# Patient Record
Sex: Male | Born: 1935 | Race: White | Hispanic: No | Smoking: Former smoker
Health system: Southern US, Community
[De-identification: ages and names within clinical notes are randomized; demographics above are authoritative.]

## PROBLEM LIST (undated history)

## (undated) DIAGNOSIS — R209 Unspecified disturbances of skin sensation: Secondary | ICD-10-CM

## (undated) DIAGNOSIS — M542 Cervicalgia: Secondary | ICD-10-CM

## (undated) DIAGNOSIS — K219 Gastro-esophageal reflux disease without esophagitis: Secondary | ICD-10-CM

## (undated) DIAGNOSIS — IMO0001 Reserved for inherently not codable concepts without codable children: Secondary | ICD-10-CM

## (undated) DIAGNOSIS — R0602 Shortness of breath: Secondary | ICD-10-CM

## (undated) DIAGNOSIS — R5381 Other malaise: Secondary | ICD-10-CM

## (undated) DIAGNOSIS — R21 Rash and other nonspecific skin eruption: Secondary | ICD-10-CM

## (undated) DIAGNOSIS — E039 Hypothyroidism, unspecified: Secondary | ICD-10-CM

## (undated) DIAGNOSIS — H269 Unspecified cataract: Secondary | ICD-10-CM

## (undated) DIAGNOSIS — G5600 Carpal tunnel syndrome, unspecified upper limb: Secondary | ICD-10-CM

## (undated) DIAGNOSIS — R269 Unspecified abnormalities of gait and mobility: Secondary | ICD-10-CM

## (undated) DIAGNOSIS — M545 Low back pain: Secondary | ICD-10-CM

## (undated) DIAGNOSIS — F411 Generalized anxiety disorder: Secondary | ICD-10-CM

## (undated) DIAGNOSIS — K5900 Constipation, unspecified: Secondary | ICD-10-CM

## (undated) DIAGNOSIS — C61 Malignant neoplasm of prostate: Secondary | ICD-10-CM

## (undated) DIAGNOSIS — M7150 Other bursitis, not elsewhere classified, unspecified site: Secondary | ICD-10-CM

## (undated) DIAGNOSIS — R29890 Loss of height: Secondary | ICD-10-CM

## (undated) DIAGNOSIS — R634 Abnormal weight loss: Secondary | ICD-10-CM

## (undated) DIAGNOSIS — E785 Hyperlipidemia, unspecified: Secondary | ICD-10-CM

## (undated) DIAGNOSIS — L738 Other specified follicular disorders: Secondary | ICD-10-CM

## (undated) DIAGNOSIS — I4891 Unspecified atrial fibrillation: Secondary | ICD-10-CM

## (undated) DIAGNOSIS — L2089 Other atopic dermatitis: Secondary | ICD-10-CM

## (undated) DIAGNOSIS — L723 Sebaceous cyst: Secondary | ICD-10-CM

## (undated) DIAGNOSIS — I10 Essential (primary) hypertension: Secondary | ICD-10-CM

## (undated) DIAGNOSIS — I499 Cardiac arrhythmia, unspecified: Secondary | ICD-10-CM

## (undated) DIAGNOSIS — K210 Gastro-esophageal reflux disease with esophagitis: Secondary | ICD-10-CM

## (undated) DIAGNOSIS — N3941 Urge incontinence: Secondary | ICD-10-CM

## (undated) DIAGNOSIS — E559 Vitamin D deficiency, unspecified: Secondary | ICD-10-CM

## (undated) DIAGNOSIS — S0180XA Unspecified open wound of other part of head, initial encounter: Secondary | ICD-10-CM

## (undated) DIAGNOSIS — R351 Nocturia: Secondary | ICD-10-CM

## (undated) HISTORY — DX: Low back pain: M54.5

## (undated) HISTORY — DX: Shortness of breath: R06.02

## (undated) HISTORY — DX: Urge incontinence: N39.41

## (undated) HISTORY — DX: Other specified follicular disorders: L73.8

## (undated) HISTORY — DX: Loss of height: R29.890

## (undated) HISTORY — DX: Cervicalgia: M54.2

## (undated) HISTORY — PX: EYE SURGERY: SHX253

## (undated) HISTORY — DX: Other atopic dermatitis: L20.89

## (undated) HISTORY — PX: LEG SURGERY: SHX1003

## (undated) HISTORY — DX: Gastro-esophageal reflux disease without esophagitis: K21.9

## (undated) HISTORY — DX: Nocturia: R35.1

## (undated) HISTORY — DX: Vitamin D deficiency, unspecified: E55.9

## (undated) HISTORY — DX: Unspecified abnormalities of gait and mobility: R26.9

## (undated) HISTORY — DX: Unspecified disturbances of skin sensation: R20.9

## (undated) HISTORY — DX: Generalized anxiety disorder: F41.1

## (undated) HISTORY — DX: Reserved for inherently not codable concepts without codable children: IMO0001

## (undated) HISTORY — DX: Malignant neoplasm of prostate: C61

## (undated) HISTORY — DX: Unspecified atrial fibrillation: I48.91

## (undated) HISTORY — DX: Sebaceous cyst: L72.3

## (undated) HISTORY — DX: Gastro-esophageal reflux disease with esophagitis: K21.0

## (undated) HISTORY — DX: Abnormal weight loss: R63.4

## (undated) HISTORY — DX: Rash and other nonspecific skin eruption: R21

## (undated) HISTORY — DX: Other bursitis, not elsewhere classified, unspecified site: M71.50

## (undated) HISTORY — DX: Unspecified cataract: H26.9

## (undated) HISTORY — DX: Carpal tunnel syndrome, unspecified upper limb: G56.00

## (undated) HISTORY — DX: Constipation, unspecified: K59.00

## (undated) HISTORY — PX: CATARACT EXTRACTION W/ INTRAOCULAR LENS  IMPLANT, BILATERAL: SHX1307

## (undated) HISTORY — DX: Unspecified open wound of other part of head, initial encounter: S01.80XA

## (undated) HISTORY — DX: Hypothyroidism, unspecified: E03.9

## (undated) HISTORY — DX: Other malaise: R53.81

---

## 1998-08-30 HISTORY — PX: APPENDECTOMY: SHX54

## 2001-01-03 ENCOUNTER — Inpatient Hospital Stay (HOSPITAL_COMMUNITY): Admission: EM | Admit: 2001-01-03 | Discharge: 2001-01-06 | Payer: Self-pay | Admitting: Emergency Medicine

## 2001-01-03 ENCOUNTER — Encounter: Payer: Self-pay | Admitting: Emergency Medicine

## 2005-03-08 ENCOUNTER — Ambulatory Visit (HOSPITAL_COMMUNITY): Admission: RE | Admit: 2005-03-08 | Discharge: 2005-03-08 | Payer: Self-pay | Admitting: Orthopedic Surgery

## 2005-08-30 DIAGNOSIS — K2100 Gastro-esophageal reflux disease with esophagitis, without bleeding: Secondary | ICD-10-CM

## 2005-08-30 DIAGNOSIS — M545 Low back pain, unspecified: Secondary | ICD-10-CM

## 2005-08-30 HISTORY — DX: Low back pain, unspecified: M54.50

## 2005-08-30 HISTORY — DX: Gastro-esophageal reflux disease with esophagitis, without bleeding: K21.00

## 2006-04-13 ENCOUNTER — Ambulatory Visit (HOSPITAL_BASED_OUTPATIENT_CLINIC_OR_DEPARTMENT_OTHER): Admission: RE | Admit: 2006-04-13 | Discharge: 2006-04-13 | Payer: Self-pay | Admitting: Urology

## 2006-05-06 ENCOUNTER — Ambulatory Visit: Admission: RE | Admit: 2006-05-06 | Discharge: 2006-05-22 | Payer: Self-pay | Admitting: Radiation Oncology

## 2006-09-27 ENCOUNTER — Ambulatory Visit: Admission: RE | Admit: 2006-09-27 | Discharge: 2006-12-26 | Payer: Self-pay | Admitting: Radiation Oncology

## 2006-11-04 ENCOUNTER — Ambulatory Visit (HOSPITAL_BASED_OUTPATIENT_CLINIC_OR_DEPARTMENT_OTHER): Admission: RE | Admit: 2006-11-04 | Discharge: 2006-11-04 | Payer: Self-pay | Admitting: Urology

## 2008-08-30 DIAGNOSIS — N3941 Urge incontinence: Secondary | ICD-10-CM

## 2008-08-30 HISTORY — DX: Urge incontinence: N39.41

## 2009-08-30 DIAGNOSIS — R209 Unspecified disturbances of skin sensation: Secondary | ICD-10-CM

## 2009-08-30 DIAGNOSIS — E559 Vitamin D deficiency, unspecified: Secondary | ICD-10-CM

## 2009-08-30 DIAGNOSIS — G5600 Carpal tunnel syndrome, unspecified upper limb: Secondary | ICD-10-CM

## 2009-08-30 DIAGNOSIS — E039 Hypothyroidism, unspecified: Secondary | ICD-10-CM

## 2009-08-30 DIAGNOSIS — IMO0001 Reserved for inherently not codable concepts without codable children: Secondary | ICD-10-CM

## 2009-08-30 DIAGNOSIS — R269 Unspecified abnormalities of gait and mobility: Secondary | ICD-10-CM

## 2009-08-30 HISTORY — DX: Hypothyroidism, unspecified: E03.9

## 2009-08-30 HISTORY — DX: Unspecified disturbances of skin sensation: R20.9

## 2009-08-30 HISTORY — DX: Carpal tunnel syndrome, unspecified upper limb: G56.00

## 2009-08-30 HISTORY — DX: Vitamin D deficiency, unspecified: E55.9

## 2009-08-30 HISTORY — DX: Unspecified abnormalities of gait and mobility: R26.9

## 2009-08-30 HISTORY — DX: Reserved for inherently not codable concepts without codable children: IMO0001

## 2010-08-30 DIAGNOSIS — L738 Other specified follicular disorders: Secondary | ICD-10-CM

## 2010-08-30 DIAGNOSIS — M7150 Other bursitis, not elsewhere classified, unspecified site: Secondary | ICD-10-CM

## 2010-08-30 DIAGNOSIS — L723 Sebaceous cyst: Secondary | ICD-10-CM

## 2010-08-30 HISTORY — DX: Other specified follicular disorders: L73.8

## 2010-08-30 HISTORY — DX: Sebaceous cyst: L72.3

## 2010-08-30 HISTORY — DX: Other bursitis, not elsewhere classified, unspecified site: M71.50

## 2010-09-20 ENCOUNTER — Encounter: Payer: Self-pay | Admitting: Internal Medicine

## 2010-11-04 ENCOUNTER — Other Ambulatory Visit: Payer: Self-pay | Admitting: Internal Medicine

## 2010-11-04 DIAGNOSIS — M549 Dorsalgia, unspecified: Secondary | ICD-10-CM

## 2010-11-05 ENCOUNTER — Other Ambulatory Visit: Payer: Self-pay

## 2010-11-05 ENCOUNTER — Ambulatory Visit
Admission: RE | Admit: 2010-11-05 | Discharge: 2010-11-05 | Disposition: A | Discharge status: Home / Self Care | Payer: Medicare Other | Source: Ambulatory Visit | Attending: Internal Medicine | Admitting: Internal Medicine

## 2010-11-05 ENCOUNTER — Inpatient Hospital Stay
Admission: RE | Admit: 2010-11-05 | Discharge: 2010-11-05 | Payer: Self-pay | Source: Ambulatory Visit | Attending: Internal Medicine | Admitting: Internal Medicine

## 2010-11-05 ENCOUNTER — Ambulatory Visit
Admission: RE | Admit: 2010-11-05 | Discharge: 2010-11-05 | Disposition: A | Discharge status: Home / Self Care | Payer: PRIVATE HEALTH INSURANCE | Source: Ambulatory Visit | Attending: Internal Medicine | Admitting: Internal Medicine

## 2010-11-05 DIAGNOSIS — M549 Dorsalgia, unspecified: Secondary | ICD-10-CM

## 2011-08-31 DIAGNOSIS — M542 Cervicalgia: Secondary | ICD-10-CM

## 2011-08-31 DIAGNOSIS — L2089 Other atopic dermatitis: Secondary | ICD-10-CM

## 2011-08-31 DIAGNOSIS — F411 Generalized anxiety disorder: Secondary | ICD-10-CM

## 2011-08-31 DIAGNOSIS — R351 Nocturia: Secondary | ICD-10-CM

## 2011-08-31 DIAGNOSIS — I4891 Unspecified atrial fibrillation: Secondary | ICD-10-CM

## 2011-08-31 DIAGNOSIS — R0602 Shortness of breath: Secondary | ICD-10-CM

## 2011-08-31 HISTORY — DX: Generalized anxiety disorder: F41.1

## 2011-08-31 HISTORY — DX: Shortness of breath: R06.02

## 2011-08-31 HISTORY — DX: Cervicalgia: M54.2

## 2011-08-31 HISTORY — DX: Unspecified atrial fibrillation: I48.91

## 2011-08-31 HISTORY — DX: Nocturia: R35.1

## 2011-08-31 HISTORY — DX: Other atopic dermatitis: L20.89

## 2011-09-23 ENCOUNTER — Other Ambulatory Visit: Payer: Self-pay | Admitting: Dermatology

## 2012-08-29 ENCOUNTER — Other Ambulatory Visit: Payer: Self-pay | Admitting: Nurse Practitioner

## 2012-08-29 ENCOUNTER — Ambulatory Visit
Admission: RE | Admit: 2012-08-29 | Discharge: 2012-08-29 | Disposition: A | Discharge status: Home / Self Care | Payer: Medicare Other | Source: Ambulatory Visit | Attending: Nurse Practitioner | Admitting: Nurse Practitioner

## 2012-08-29 DIAGNOSIS — R0602 Shortness of breath: Secondary | ICD-10-CM

## 2012-09-01 DIAGNOSIS — R5381 Other malaise: Secondary | ICD-10-CM

## 2012-09-01 HISTORY — DX: Other malaise: R53.81

## 2012-10-10 ENCOUNTER — Encounter (HOSPITAL_COMMUNITY): Payer: Self-pay | Admitting: Gastroenterology

## 2012-10-10 ENCOUNTER — Encounter (HOSPITAL_COMMUNITY): Payer: Self-pay | Admitting: *Deleted

## 2012-10-10 ENCOUNTER — Ambulatory Visit (HOSPITAL_COMMUNITY)
Admission: RE | Admit: 2012-10-10 | Discharge: 2012-10-10 | Disposition: A | Discharge status: Home / Self Care | Payer: Medicare Other | Source: Ambulatory Visit | Attending: Cardiology | Admitting: Cardiology

## 2012-10-10 ENCOUNTER — Encounter (HOSPITAL_COMMUNITY)
Admission: RE | Disposition: A | Discharge status: Home / Self Care | Payer: Self-pay | Source: Ambulatory Visit | Attending: Cardiology

## 2012-10-10 ENCOUNTER — Ambulatory Visit (HOSPITAL_COMMUNITY): Payer: Medicare Other | Admitting: *Deleted

## 2012-10-10 DIAGNOSIS — I4891 Unspecified atrial fibrillation: Secondary | ICD-10-CM

## 2012-10-10 DIAGNOSIS — I10 Essential (primary) hypertension: Secondary | ICD-10-CM | POA: Insufficient documentation

## 2012-10-10 HISTORY — PX: CARDIOVERSION: SHX1299

## 2012-10-10 HISTORY — DX: Hyperlipidemia, unspecified: E78.5

## 2012-10-10 HISTORY — DX: Essential (primary) hypertension: I10

## 2012-10-10 HISTORY — DX: Cardiac arrhythmia, unspecified: I49.9

## 2012-10-10 SURGERY — CARDIOVERSION
Anesthesia: General

## 2012-10-10 MED ORDER — PROPOFOL 10 MG/ML IV BOLUS
INTRAVENOUS | Status: DC | PRN
  Administered 2012-10-10: 40 mg via INTRAVENOUS

## 2012-10-10 MED ORDER — LIDOCAINE HCL (CARDIAC) 20 MG/ML IV SOLN
INTRAVENOUS | Status: DC | PRN
  Administered 2012-10-10: 40 mg via INTRAVENOUS

## 2012-10-10 MED ORDER — SODIUM CHLORIDE 0.9 % IV SOLN
INTRAVENOUS | Status: DC
  Administered 2012-10-10: 500 mL via INTRAVENOUS

## 2012-10-10 MED ORDER — SODIUM CHLORIDE 0.9 % IV SOLN
INTRAVENOUS | Status: DC | PRN
  Administered 2012-10-10: 12:00:00 via INTRAVENOUS

## 2012-10-10 NOTE — Anesthesia Preprocedure Evaluation (Addendum)
Anesthesia Evaluation  Patient identified by MRN, date of birth, ID band Patient awake    Reviewed: Allergy & Precautions, H&P , NPO status , Patient's Chart, lab work & pertinent test results, reviewed documented beta blocker date and time   Airway Mallampati: II      Dental  (+) Upper Dentures, Lower Dentures and Dental Advisory Given   Pulmonary neg pulmonary ROS,  breath sounds clear to auscultation        Cardiovascular hypertension, Pt. on medications and Pt. on home beta blockers + dysrhythmias Atrial Fibrillation Rhythm:Irregular Rate:Normal  Echo 08/2012, ef 55%   Neuro/Psych    GI/Hepatic negative GI ROS, Neg liver ROS,   Endo/Other  negative endocrine ROS  Renal/GU negative Renal ROS     Musculoskeletal   Abdominal Normal abdominal exam  (+)   Peds  Hematology negative hematology ROS (+)   Anesthesia Other Findings   Reproductive/Obstetrics                          Anesthesia Physical Anesthesia Plan  ASA: III  Anesthesia Plan: General   Post-op Pain Management:    Induction: Intravenous  Airway Management Planned: Mask  Additional Equipment:   Intra-op Plan:   Post-operative Plan:   Informed Consent: I have reviewed the patients History and Physical, chart, labs and discussed the procedure including the risks, benefits and alternatives for the proposed anesthesia with the patient or authorized representative who has indicated his/her understanding and acceptance.   Dental advisory given  Plan Discussed with: Anesthesiologist and Surgeon  Anesthesia Plan Comments:        Anesthesia Quick Evaluation

## 2012-10-10 NOTE — Preoperative (Signed)
Beta Blockers   Reason not to administer Beta Blockers:Not Applicable 

## 2012-10-10 NOTE — Anesthesia Postprocedure Evaluation (Signed)
  Anesthesia Post-op Note  Patient: Wyatt Perkins  Procedure(s) Performed: Procedure(s): CARDIOVERSION (N/A)  Patient Location: PACU  Anesthesia Type:General  Level of Consciousness: awake and patient cooperative  Airway and Oxygen Therapy: Patient Spontanous Breathing  Post-op Pain: none  Post-op Assessment: Post-op Vital signs reviewed, Patient's Cardiovascular Status Stable, Respiratory Function Stable, Patent Airway, No signs of Nausea or vomiting and Pain level controlled  Post-op Vital Signs: stable  Complications: No apparent anesthesia complications

## 2012-10-10 NOTE — Transfer of Care (Signed)
Immediate Anesthesia Transfer of Care Note  Patient: Wyatt Perkins  Procedure(s) Performed: Procedure(s): CARDIOVERSION (N/A)  Patient Location: PACU  Anesthesia Type:General  Level of Consciousness: awake, oriented, patient cooperative and responds to stimulation  Airway & Oxygen Therapy: Patient Spontanous Breathing and Patient connected to nasal cannula oxygen  Post-op Assessment: Report given to PACU RN and Post -op Vital signs reviewed and stable  Post vital signs: Reviewed and stable  Complications: No apparent anesthesia complications

## 2012-10-10 NOTE — CV Procedure (Signed)
Direct current cardioversion:  Indication symptomatic A. Fibrillation.  Procedure: Using 40 mg of IV Propofol and 40 mg  Lidocaine for achieving deep (Moderate sedation), synchronized direct current cardioversion performed. Patient was delivered with 100 Joules of electricity X 1 with success to NSR. Patient tolerated the procedure well. No immediate complication noted.

## 2012-10-10 NOTE — H&P (Signed)
  Please see paper chart  

## 2012-10-10 NOTE — Interval H&P Note (Signed)
History and Physical Interval Note:  10/10/2012 12:30 PM  Wyatt Perkins  has presented today for surgery, with the diagnosis of a-fib  The various methods of treatment have been discussed with the patient and family. After consideration of risks, benefits and other options for treatment, the patient has consented to  Procedure(s): CARDIOVERSION (N/A) as a surgical intervention .  The patient's history has been reviewed, patient examined, no change in status, stable for surgery.  I have reviewed the patient's chart and labs.  Questions were answered to the patient's satisfaction.     [name]

## 2012-10-11 ENCOUNTER — Encounter (HOSPITAL_COMMUNITY): Payer: Self-pay | Admitting: Cardiology

## 2012-10-25 DIAGNOSIS — R634 Abnormal weight loss: Secondary | ICD-10-CM

## 2012-10-25 HISTORY — DX: Abnormal weight loss: R63.4

## 2012-11-03 DIAGNOSIS — S0180XA Unspecified open wound of other part of head, initial encounter: Secondary | ICD-10-CM

## 2012-11-03 HISTORY — DX: Unspecified open wound of other part of head, initial encounter: S01.80XA

## 2012-12-20 ENCOUNTER — Non-Acute Institutional Stay: Payer: Medicare Other | Admitting: Geriatric Medicine

## 2012-12-20 ENCOUNTER — Encounter: Payer: Self-pay | Admitting: Geriatric Medicine

## 2012-12-20 VITALS — BP 112/72 | HR 68 | Wt 149.0 lb

## 2012-12-20 DIAGNOSIS — K219 Gastro-esophageal reflux disease without esophagitis: Secondary | ICD-10-CM

## 2012-12-20 DIAGNOSIS — F411 Generalized anxiety disorder: Secondary | ICD-10-CM

## 2012-12-20 DIAGNOSIS — R634 Abnormal weight loss: Secondary | ICD-10-CM

## 2012-12-20 DIAGNOSIS — I4891 Unspecified atrial fibrillation: Secondary | ICD-10-CM

## 2012-12-20 HISTORY — DX: Gastro-esophageal reflux disease without esophagitis: K21.9

## 2012-12-20 NOTE — Assessment & Plan Note (Addendum)
Heart w/ regular rhythm today. Continues Xarelto. Will need interval CBC,BMP, most recent satisfcatory

## 2012-12-20 NOTE — Assessment & Plan Note (Signed)
Patient had 20 pound weight loss from December 2013 to February 2014. He said that 4 pound weight loss since February. No specific GI complaints other than indigestion. Denies any change in stool a day change in bowel habits or gross blood in the stool. Will check for occult blood in stool.

## 2012-12-20 NOTE — Assessment & Plan Note (Signed)
Long-standing problem with reflux. Until several months ago was well controlled with omeprazole and occasional TUMS. Symptoms worsened December 2013, omeprazole dose increased. Family notes that patient is taking TUMS nearly everyday on a recent vacation. Patient tells me it isn't really every day maybe one to 3 times a week. Asked patient to keep track of how often he requires TUMS, have patient come back in 2 weeks, consider GI consult.

## 2012-12-20 NOTE — Progress Notes (Signed)
Patient ID: Wyatt Perkins, male   DOB: 22-Sep-1925, 79 y.o.   MRN: [id number] Chief Complaint  Patient presents with  . Medical Managment of Chronic Issues    weight loss, anxiety, blood pressure, A-Fib   HPI: This 79 year old male resident of [facility], Assisted Living section, returns to clinic today in followup of his anxiety, weight loss, hypertension, atrial fibrillation. He has generally been doing well; has recently returned from a very enjoyable vacation at the beach with his family. He continues to be very focused on his spouse and her situation in Memory Care. Blood pressure has remained reasonably well-controlled, atrial fibrillation stable. He has remained in regular rhythm since cardioversion in February 2014. He has continued with some further weight loss since last visit. Patient reports that he is eating very well, has no GI complaints, notes no change in his bowel habits. Does have  persistent indigestion despite daily PPI. He reports that taking TUMS helps. Patient reports TUMS one to 3 days a week however family recently reported that he was eating TUMS daily while on vacation.  Allergies Reviewed   Medications Reviewed   Data Reviewed    Radiology:   Lab [facility], external 08/01/2012 BMP; Sodium 132, Potassium 4.2, glucose 100, BUN 16, Creatinine 0.83 TSH 6.613 PSA 0.08 08/28/12: WBC 10.9, hgb 12.5, Hct 36.6, Plt 281 TSH 3.32 Glu 90, BUN 12, Cr. .89, na 129, K+ 3.9, prot/albumin WNL. AST 63, ALT 64 CK, MB 20.4 08/29/12  CK, total 887 Sed Rate 2 C-Reactive Protein 2.31 Troponin 1 0.04 09/14/2012 BMP: Sodium 122, Potassium 3.5, glucose 102, BUN 12, Creatinine 0.81  10/26/2012 WBC 6.4, hemoglobin 12.7, hematocrit hematocrit 36.5, platelets 222  Glucose 85, BUN 13, creatinine 0.80, sodium 127, potassium 3.0. A 3.8  Total cholesterol 158, triglyceride 84, HDL LDL 108  TSH 2.39  12/07/2012: Glu93, BUN 19. Cr. .81, Na 134, K+ 3.5  CRP <0.5     Review of  Systems   DATA OBTAINED: from patient,medical record. GENERAL: Feels well. No fevers, fatigue, change in activity status. No change in appetite. Further weight loss  SKIN: No itch, rash or open wounds EYES: No eye pain, dryness or itching. No change in vision EARS: No earache, tinnitus, change in hearing NOSE: No congestion, drainage or bleeding MOUTH/THROAT: No mouth or tooth pain. No difficulty chewing or swallowing. RESPIRATORY: No cough, wheezing, SOB CARDIAC: No chest pain, palpitations. No edema. GI: No abdominal pain. No N/V/D or constipation. Frequent indigestion, see history of present illness.  GU: No dysuria, frequency or urgency. No nocturia or change in stream. No change in urine volume or character MUSCULOSKELETAL: No joint pain, swelling or stiffness. No back pain. No muscle ache, pain, weakness. Gait is steady.   NEUROLOGIC: No dizziness, fainting, headache, imbalance, numbness. No change in mental status.  PSYCHIATRIC: Anxiety present. No depression, behavior issue. Sleeps well.    Physical Exam    Filed Vitals:   12/20/12 1001  BP: 112/72  Pulse: 68   Filed Weights   12/20/12 1001  Weight: 149 lb (67.586 kg)   GENERAL APPEARANCE: No acute distress, appropriately groomed, normal body habitus. Alert, pleasant, conversant. HEAD: Normocephalic, atraumatic EYES: Conjunctiva/lids clear. Pupils round, reactive.  EARS:Hearing grossly normal. NOSE: No deformity or discharge. MOUTH/THROAT: Lips w/o lesions. Oral mucosa, tongue moist, w/o lesion. Oropharynx w/o redness or lesions.  NECK: Supple, full ROM. No thyroid tenderness, enlargement or nodule LYMPHATICS: No head, neck or supraclavicular adenopathy RESPIRATORY: Breathing is even, unlabored. Lung sounds  are clear and full.  CARDIOVASCULAR: Heart RRR. No murmur or extra heart sounds   EDEMA: Trace peripheral edema.   GASTROINTESTINAL: Abdomen is soft, non-tender, not distended w/ normal bowel sounds.   MUSCULOSKELETAL: Moves all extremities with full ROM, strength and tone. Back is without kyphosis, scoliosis or spinal process tenderness. Gait is steady NEUROLOGIC: Oriented to time, place, person. Speech clear, no tremor.  PSYCHIATRIC: Mood and affect appropriate to situation  ASSESSMENT/PLAN  Atrial fibrillation Heart w/ regular rhythm today. Continues Xarelto. Will need interval CBC,BMP, most recent satisfcatory  GERD (gastroesophageal reflux disease) Long-standing problem with reflux. Until several months ago was well controlled with omeprazole and occasional TUMS. Symptoms worsened December 2013, omeprazole dose increased. Family notes that patient is taking TUMS nearly everyday on a recent vacation. Patient tells me it isn't really every day maybe one to 3 times a week. Asked patient to keep track of how often he requires TUMS, have patient come back in 2 weeks, consider GI consult.  Loss of weight Patient had 20 pound weight loss from December 2013 to February 2014. He said that 4 pound weight loss since February. No specific GI complaints other than indigestion. Denies any change in stool a day change in bowel habits or gross blood in the stool. Will check for occult blood in stool.   Anxiety state, unspecified Patient reports vacation away from [facility] was "wonderful". Since his return, continues to visit his wife daily in Memory Care unit. Patient reports his family is encouraging him to participate in other activities. He acknowledges this is a good idea but he has not followed through. Pt. Continues to display need to verbally work out each detail of his life.  He has not been back to see Dr. Jen Mow lately, he cites a problem with the office location.  Encouraged to arrange return appointment.    Follow up:  2weeks  Vearl Aitken T.Verlin Uher, NP-C 12/20/2012

## 2012-12-24 NOTE — Assessment & Plan Note (Signed)
Patient reports vacation away from [facility] was "wonderful". Since his return, continues to visit his wife daily in Memory Care unit. Patient reports his family is encouraging him to participate in other activities. He acknowledges this is a good idea but he has not followed through. Pt. Continues to display need to verbally work out each detail of his life.  He has not been back to see Dr. Jen Mow lately, he cites a problem with the office location.  Encouraged to arrange return appointment.

## 2012-12-25 ENCOUNTER — Encounter: Payer: Self-pay | Admitting: Internal Medicine

## 2012-12-25 ENCOUNTER — Non-Acute Institutional Stay: Payer: Medicare Other | Admitting: Internal Medicine

## 2012-12-25 VITALS — BP 128/72 | HR 78 | Wt 150.0 lb

## 2012-12-25 DIAGNOSIS — R21 Rash and other nonspecific skin eruption: Secondary | ICD-10-CM

## 2012-12-25 DIAGNOSIS — K219 Gastro-esophageal reflux disease without esophagitis: Secondary | ICD-10-CM

## 2012-12-25 NOTE — Progress Notes (Signed)
  Subjective:    Patient ID: Wyatt Perkins, male    DOB: 1926-08-08, 79 y.o.   MRN: [id number]  HPI  Rash for 1-2 week. He used Amlactin moisturizing body lotion, but it made things worse. Rash is scattered over the right and left arms and legs. Scaly lesions on the legs have been there for a long time. Pruritic and burning sensations.  Also having acid reflux.Marland Kitchen Has been using Maalox 2-4 days per week. Hx of recurrent reflux. Worse at night.  Review of Systems  Constitutional: Negative for fever, chills, diaphoresis, activity change, appetite change, fatigue and unexpected weight change.  HENT: Negative for hearing loss, ear pain, nosebleeds, congestion, facial swelling, rhinorrhea, neck pain, neck stiffness, tinnitus and ear discharge.   Eyes: Negative.   Cardiovascular: Negative for chest pain, palpitations and leg swelling.  Gastrointestinal: Positive for constipation. Negative for abdominal pain, abdominal distention and anal bleeding.  Endocrine: Negative.   Skin:       Rash as described.  Allergic/Immunologic: Negative.   Neurological: Positive for numbness.  Hematological: Negative.   Psychiatric/Behavioral: The patient is nervous/anxious.        Objective:   Physical Exam  Nursing note and vitals reviewed. Constitutional: He is oriented to person, place, and time. He appears well-developed and well-nourished. No distress.  HENT:  Head: Normocephalic and atraumatic.  Mouth/Throat: Oropharynx is clear and moist. No oropharyngeal exudate.  Loss of hearing  Neck: Normal range of motion. Neck supple. No JVD present. No tracheal deviation present. No thyromegaly present.  Cardiovascular: Normal rate, normal heart sounds and intact distal pulses.  Exam reveals no gallop and no friction rub.   No murmur heard. AF.  Pulmonary/Chest: Effort normal and breath sounds normal. No respiratory distress. He has no wheezes.  Abdominal: Soft. Bowel sounds are normal. He exhibits no  distension and no mass. There is no tenderness.  Musculoskeletal: Normal range of motion. He exhibits no edema and no tenderness.  Lymphadenopathy:    He has no cervical adenopathy.  Neurological: He is alert and oriented to person, place, and time. He has normal reflexes. No cranial nerve deficit. Coordination normal.  Skin: He is not diaphoretic.  Red cracked and scaling skin. Worse on the forearms, r>L. Rash on the legs has a different appearance. There are red blotches, but the cracks and peeling on the forearms is missing. Rash is limited to sun exposed areas. No rash on the trunk or back or upper thighs posteriorly.  Psychiatric: He has a normal mood and affect. His behavior is normal. Judgment and thought content normal.          Assessment & Plan:  Rash and other nonspecific skin eruption  This is difficult to tell whether this is a phototoxic reaction or a mildly sunburned area that was made worse by applying AmLactin lotion. I believe it should respond to triamcinolone acetonide 0.1% cream daily.  GERD (gastroesophageal reflux disease)  Recurrent problems reflux especially at night. Try ranitidine 75 mg nightly.

## 2012-12-26 MED ORDER — TRIAMCINOLONE ACETONIDE 0.1 % EX CREA: TOPICAL_CREAM | CUTANEOUS | Status: DC

## 2012-12-26 MED ORDER — RANITIDINE HCL 75 MG PO TABS: ORAL_TABLET | ORAL | Status: DC

## 2012-12-26 NOTE — Patient Instructions (Signed)
Return in 2 weeks for reevaluation. You'll be seeing [name] that time. You have a follow up visit to see Dr. Chilton Si 02/05/13.

## 2013-01-10 ENCOUNTER — Encounter: Payer: Self-pay | Admitting: Geriatric Medicine

## 2013-01-10 ENCOUNTER — Non-Acute Institutional Stay: Payer: Medicare Other | Admitting: Geriatric Medicine

## 2013-01-10 VITALS — BP 144/68 | HR 80 | Ht 67.0 in | Wt 148.0 lb

## 2013-01-10 DIAGNOSIS — R21 Rash and other nonspecific skin eruption: Secondary | ICD-10-CM

## 2013-01-10 DIAGNOSIS — Z299 Encounter for prophylactic measures, unspecified: Secondary | ICD-10-CM

## 2013-01-10 DIAGNOSIS — R634 Abnormal weight loss: Secondary | ICD-10-CM

## 2013-01-10 DIAGNOSIS — F411 Generalized anxiety disorder: Secondary | ICD-10-CM

## 2013-01-10 DIAGNOSIS — K219 Gastro-esophageal reflux disease without esophagitis: Secondary | ICD-10-CM

## 2013-01-10 NOTE — Assessment & Plan Note (Signed)
Continues to have episodes of tearfulness re: spouse's situation /cognitive decline. Spent 30 minutes with patient allowing him to express his feelings and offer strategies to reduce stress/ anxiety. Suggested increased interactions with other men in community who are facing simlar situations.

## 2013-01-10 NOTE — Assessment & Plan Note (Signed)
Sumpotms improved wit addition of ranitidine; no recent use of TUMS.

## 2013-01-10 NOTE — Progress Notes (Signed)
Patient ID: Wyatt Perkins, male   DOB: 09-Apr-1926, 79 y.o.   MRN: [id number] [facility] [phone])  Chief Complaint  Patient presents with  . Medical Managment of Chronic Issues    GERD, follow up on rash    HPI: This 79 year old male resident of [facility], Assisted Living section returns to clinic today in followup of GERD and a rash on his right forearm. At last visit with Dr. Chilton Si he added ranitidine to patient's medications, patient tells me he has not needed to use any TUMS since that time. Patient does tend to minimize GERD symptoms, reporting that he is had this all his life. He does specifically deny any chest discomfort,coughing, nausea. Patient has been applying triamcinolone cream as directed onto his right forearm rash. It is nearly healed. Patient continues to feel significant stress related to his spouse's Alzheimer's disease and new delirium.  Allergies  Allergies  Allergen Reactions  . Simvastatin     myalgias  . Statins     All statins cause muscle cramps   Medications Reviewed  Data Reviewed       [id number], external 08/01/2012 BMP; Sodium 132, Potassium 4.2, glucose 100, BUN 16, Creatinine 0.83  TSH 6.613  PSA 0.08  08/28/12: WBC 10.9, hgb 12.5, Hct 36.6, Plt 281  TSH 3.32  Glu 90, BUN 12, Cr. .89, na 129, K+ 3.9, prot/albumin WNL. AST 63, ALT 64  CK, MB 20.4  08/29/12 CK, total 887  Sed Rate 2  C-Reactive Protein 2.31  Troponin 1 0.04  09/14/2012 BMP: Sodium 122, Potassium 3.5, glucose 102, BUN 12, Creatinine 0.81  10/26/2012 WBC 6.4, hemoglobin 12.7, hematocrit hematocrit 36.5, platelets 222  Glucose 85, BUN 13, creatinine 0.80, sodium 127, potassium 3.0. A 3.8  Total cholesterol 158, triglyceride 84, HDL LDL 108  TSH 2.39  12/07/2012: Glu93, BUN 19. Cr. .81, Na 134, K+ 3.5  CRP <0.5  Review of Systems  DATA OBTAINED: from patient,medical record.  GENERAL: Feels well. No fevers, fatigue, change in  activity status. No change in appetite. Further weight loss (2lb) SKIN: No itch, rash or open wounds  EYES: No eye pain, dryness or itching. No change in vision  EARS: No earache, tinnitus, change in hearing  NOSE: No congestion, drainage or bleeding  MOUTH/THROAT: No mouth or tooth pain. No difficulty chewing or swallowing.  RESPIRATORY: No cough, wheezing, SOB  CARDIAC: No chest pain, palpitations. No edema.  GI: No abdominal pain. No N/V/D or constipation. Frequent indigestion, see history of present illness.  GU: No dysuria, frequency or urgency. No nocturia or change in stream. No change in urine volume or character  MUSCULOSKELETAL: No joint pain, swelling or stiffness. No back pain. No muscle ache, pain, weakness. Gait is steady.  NEUROLOGIC: No dizziness, fainting, headache, imbalance, numbness. No change in mental status.  PSYCHIATRIC: Anxiety present. No depression, behavior issue. Sleeps well.   Physical Exam Filed Vitals:   01/10/13 0956  BP: 144/68  Pulse: 80  Height: 5\' 7"  (1.702 m)  Weight: 148 lb (67.132 kg)   Body mass index is 23.17 kg/(m^2).  GENERAL APPEARANCE: No acute distress, appropriately groomed, normal body habitus. Alert, pleasant, conversant.  HEAD: Normocephalic, atraumatic  EYES: Conjunctiva/lids clear. Pupils round, reactive.  EARS:Hearing grossly normal.  NOSE: No deformity or discharge.  MOUTH/THROAT: Lips w/o lesions. Oral mucosa, tongue moist, w/o lesion. Oropharynx w/o redness or lesions.  NECK: Supple, full ROM. No thyroid tenderness, enlargement or nodule  LYMPHATICS: No head, neck  or supraclavicular adenopathy  RESPIRATORY: Breathing is even, unlabored. Lung sounds are clear and full.  CARDIOVASCULAR: Heart RRR. No murmur or extra heart sounds  EDEMA: Trace peripheral edema.  GASTROINTESTINAL: Abdomen is soft, non-tender, not distended w/ normal bowel sounds.  MUSCULOSKELETAL: Moves all extremities with full ROM, strength and tone. Back is  without kyphosis, scoliosis or spinal process tenderness. Gait is steady  NEUROLOGIC: Oriented to time, place, person. Speech clear, no tremor.  PSYCHIATRIC: Mood and affect appropriate to situation    ASSESSMENT/PLAN  Anxiety state, unspecified Continues to have episodes of tearfulness re: spouse's situation /cognitive decline. Spent 30 minutes with patient allowing him to express his feelings and offer strategies to reduce stress/ anxiety. Suggested increased interactions with other men in community who are facing simlar situations.  GERD (gastroesophageal reflux disease) Sumpotms improved wit addition of ranitidine; no recent use of TUMS.  Loss of weight Weight stable at 148-150lbs last 3 months. Pt. continues to report good appetite, though he does admit to leaving food on the plate sometimes (portions too big). NO GI symptoms, stool heme negative. No intervention at this time, continue to monitor  Rash and other nonspecific skin eruption Rash Rt. Forearm nearly healed. Continue topical treatment until tube is finished   40 minutes spent with patient  Follow up: As scheduled  Oryon Gary T.Dexton Zwilling, NP-C 01/10/2013

## 2013-01-10 NOTE — Assessment & Plan Note (Signed)
Rash Rt. Forearm nearly healed. Continue topical treatment until tube is finished

## 2013-01-10 NOTE — Assessment & Plan Note (Signed)
Weight stable at 148-150lbs last 3 months. Pt. continues to report good appetite, though he does admit to leaving food on the plate sometimes (portions too big). NO GI symptoms, stool heme negative. No intervention at this time, continue to monitor

## 2013-02-02 ENCOUNTER — Encounter: Payer: Self-pay | Admitting: *Deleted

## 2013-02-05 ENCOUNTER — Encounter: Payer: Self-pay | Admitting: Internal Medicine

## 2013-02-05 ENCOUNTER — Non-Acute Institutional Stay: Payer: Medicare Other | Admitting: Internal Medicine

## 2013-02-05 VITALS — BP 120/64 | HR 60 | Ht 67.0 in | Wt 148.0 lb

## 2013-02-05 DIAGNOSIS — R634 Abnormal weight loss: Secondary | ICD-10-CM

## 2013-02-05 DIAGNOSIS — K219 Gastro-esophageal reflux disease without esophagitis: Secondary | ICD-10-CM

## 2013-02-05 DIAGNOSIS — C61 Malignant neoplasm of prostate: Secondary | ICD-10-CM

## 2013-02-05 DIAGNOSIS — N643 Galactorrhea not associated with childbirth: Secondary | ICD-10-CM | POA: Insufficient documentation

## 2013-02-05 DIAGNOSIS — I4891 Unspecified atrial fibrillation: Secondary | ICD-10-CM

## 2013-02-05 DIAGNOSIS — E785 Hyperlipidemia, unspecified: Secondary | ICD-10-CM

## 2013-02-05 DIAGNOSIS — R197 Diarrhea, unspecified: Secondary | ICD-10-CM | POA: Insufficient documentation

## 2013-02-05 DIAGNOSIS — E039 Hypothyroidism, unspecified: Secondary | ICD-10-CM

## 2013-02-05 MED ORDER — PSYLLIUM 28 % PO PACK: PACK | ORAL | Status: DC

## 2013-02-05 NOTE — Progress Notes (Signed)
  Subjective:    Patient ID: Wyatt Perkins, male    DOB: 15-Jan-1926, 79 y.o.   MRN: [id number]  HPI Has lost weight.  Unspecified hypothyroidism: Controlled. TSH was high at 6.613 07/31/12 was 5.386 on 01/25/12. It needs to be rechecked.  Malignant neoplasm of prostate: History. No evidence for relapse.  Other and unspecified hyperlipidemia: Most recent lab showed adequate control.  Loss of weight: Etiology is uncertain. He tends to blame this on his medications, but I don't really see any association  Atrial fibrillation: New since the beginning of the year. Currently rate controlled. Has seen Dr. Jacinto Halim.  GERD (gastroesophageal reflux disease): Asymptomatic  Diarrhea: Patient was put on soma derivatives and has had multiple loose stools since then.He is asking for a change in his medications.     Review of Systems  Constitutional: Negative for fever, chills, diaphoresis, activity change, appetite change, fatigue and unexpected weight change.  HENT: Negative for hearing loss, ear pain, nosebleeds, congestion, facial swelling, rhinorrhea, neck pain, neck stiffness, tinnitus and ear discharge.   Eyes: Negative.   Cardiovascular: Negative for chest pain, palpitations and leg swelling.  Gastrointestinal: Positive for diarrhea. Negative for abdominal pain, constipation, abdominal distention and anal bleeding.  Endocrine: Negative.   Skin:       Rash is resolved.  Allergic/Immunologic: Negative.   Neurological: Positive for numbness.  Hematological: Negative.   Psychiatric/Behavioral: The patient is nervous/anxious.        Objective:   Physical Exam  Nursing note and vitals reviewed. Constitutional: He is oriented to person, place, and time. He appears well-developed and well-nourished. No distress.  HENT:  Head: Normocephalic and atraumatic.  Mouth/Throat: Oropharynx is clear and moist. No oropharyngeal exudate.  Loss of hearing  Neck: Normal range of motion. Neck supple. No  JVD present. No tracheal deviation present. No thyromegaly present.  Cardiovascular: Normal rate, normal heart sounds and intact distal pulses.  Exam reveals no gallop and no friction rub.   No murmur heard. AF.  Pulmonary/Chest: Effort normal and breath sounds normal. No respiratory distress. He has no wheezes.  Abdominal: Soft. Bowel sounds are normal. He exhibits no distension and no mass. There is no tenderness.  Musculoskeletal: Normal range of motion. He exhibits no edema and no tenderness.  Lymphadenopathy:    He has no cervical adenopathy.  Neurological: He is alert and oriented to person, place, and time. He has normal reflexes. No cranial nerve deficit. Coordination normal.  Skin: He is not diaphoretic.  Continues with a mild rash, but it is virtually completely gone. He continues to use steroid cream on this area.  Psychiatric: He has a normal mood and affect. His behavior is normal. Judgment and thought content normal.          Assessment & Plan:  1. Unspecified hypothyroidism Elevated TSH. Followup lab.  2. Malignant neoplasm of prostate No evidence of relapse.  3. Other and unspecified hyperlipidemia Control.  4. Loss of weight Etiology not established. He seems to be regaining weight at this time.  5. Atrial fibrillation Chronic since December 2013. Rate controlled.  6. GERD (gastroesophageal reflux disease) Asymptomatic  7. Diarrhea Possibly induced by laxatives. Discontinued senna - psyllium (METAMUCIL SMOOTH TEXTURE) 28 % packet; One tablespoon in 8 oz fluid daily to regulate stools  Dispense: 30 packet; Refill: 5  8.Rash: Advised to discontinue steroid creams   Return in 6 months for extended visit

## 2013-02-05 NOTE — Patient Instructions (Addendum)
Continue current medication, except we have substituted Metamucil for the Senna to help regulate stools. Reduce and discontinue the steroid creams for the previous rash on her arms.

## 2013-02-08 ENCOUNTER — Encounter: Payer: Self-pay | Admitting: Internal Medicine

## 2013-02-08 ENCOUNTER — Ambulatory Visit (INDEPENDENT_AMBULATORY_CARE_PROVIDER_SITE_OTHER): Payer: Medicare Other | Admitting: Internal Medicine

## 2013-02-08 DIAGNOSIS — Z23 Encounter for immunization: Secondary | ICD-10-CM

## 2013-02-08 MED ORDER — TETANUS-DIPHTH-ACELL PERTUSSIS 5-2.5-18.5 LF-MCG/0.5 IM SUSP
0.5000 mL | Freq: Once | INTRAMUSCULAR | Status: AC
  Administered 2013-02-08: 0.5 mL via INTRAMUSCULAR

## 2013-02-08 MED ORDER — PNEUMOCOCCAL VAC POLYVALENT 25 MCG/0.5ML IJ INJ
0.5000 mL | INJECTION | Freq: Once | INTRAMUSCULAR | Status: AC
  Administered 2013-02-08: 0.5 mL via INTRAMUSCULAR

## 2013-02-08 NOTE — Patient Instructions (Addendum)
You were given information sheets about the tetanus and pneumococcal vaccinations you received. Pneumonia was in left arm, tetanus in right.

## 2013-02-08 NOTE — Progress Notes (Signed)
Patient ID: Wyatt Perkins, male   DOB: [birth year], 79 y.o.   MRN: [id number] Pt was given his pneumococcal and tetanus vaccinations today.

## 2013-02-09 ENCOUNTER — Other Ambulatory Visit: Payer: Self-pay | Admitting: Dermatology

## 2013-03-28 ENCOUNTER — Non-Acute Institutional Stay: Payer: Medicare Other | Admitting: Geriatric Medicine

## 2013-03-28 ENCOUNTER — Encounter: Payer: Self-pay | Admitting: Geriatric Medicine

## 2013-03-28 VITALS — BP 128/82 | HR 88 | Ht 67.0 in | Wt 160.0 lb

## 2013-03-28 DIAGNOSIS — F411 Generalized anxiety disorder: Secondary | ICD-10-CM

## 2013-03-28 DIAGNOSIS — I4891 Unspecified atrial fibrillation: Secondary | ICD-10-CM

## 2013-03-28 DIAGNOSIS — K5900 Constipation, unspecified: Secondary | ICD-10-CM

## 2013-03-28 NOTE — Progress Notes (Signed)
Patient ID: Wyatt Perkins, male   DOB: Jan 08, 1926, 79 y.o.   MRN: [id number] [facility] [phone])  Code Status: DNR  Contact Information   Name Relation Home Work Mobile   Perkins,Wyatt Daughter [phone]        Chief Complaint  Patient presents with  . Depression    wife died few weeks ago, here with son Wyatt Perkins    HPI: This is a 79 y.o. male resident of [facility],  Assisted Living section.  Evaluation is requested today due to shortness of breath.  Patient and family have noted increased shortness of breath and decreased activity status over the last few weeks. He has needed to make frequent rest stops when walking any distance, also notes that he has awakened up during the night "uneasy for air". All these symptoms have improved over the last few days. In fact, today he was able to walk from his apartment all the way to the dining room without stopping. Review of nurses record shows that his blood pressure and pulse readings have been satisfactory. His weight has improved. Patient has been under significant amount of stress this year due his spouse's declining health. This culminated about 2 weeks ago with her death. Patient tells me today he feels he is getting back to some normalcy. Continues to have regular followup with psychiatrist Dr. Jen Perkins    Allergies  Allergen Reactions  . Simvastatin     myalgias  . Statins     All statins cause muscle cramps   Medications Reviewed  DATA REVIEWED  Laboratory Studies:  [facility] 09/14/2012 BMP: Sodium 122, Potassium 3.5, glucose 102, BUN 12, Creatinine 0.81    10/26/2012 WBC 6.4, hemoglobin 12.7, hematocrit hematocrit 36.5, platelets 222    Glucose 85, BUN 13, creatinine 0.80, sodium 127, potassium 3.0. A 3.8    Total cholesterol 158, triglyceride 84, HDL LDL 108    TSH 2.39   9/56/2130: Glu93, BUN 19. Cr. .81, Na 134, K+ 3.5    CRP <0.5  01/30/2013: WBC 5.9, Hgb 13.7, Hct 39.0,  Plt 241  Glu 90, BUN 12 , Cr.80  TSH 5.20  PSA 0.04  History   Social History Narrative   [name] Widowed (02/2013), was married to Wyatt Perkins since 1951. Retired from [employer].  Lives in Assisted Living  section at [facility] since 08/2012, [place] lived [place] apartment in same community [place]  2008.Marland Kitchen   Stopped smoking 1960s.. Drinks moderate alcohol (wine)   Patient has  Advanced planning documents: Living Will, DNR       Review of Systems  DATA OBTAINED: from patient,  medical record,  family member GENERAL: Feels well   No fevers, fatigue, change in appetite or weight SKIN: No itch, rash or open wounds EYES: No eye pain, dryness or itching  No change in vision EARS: No earache, tinnitus, change in hearing NOSE: No congestion, drainage or bleeding MOUTH/THROAT: No mouth or tooth pain  No sore throat No difficulty chewing or swallowing RESPIRATORY: No cough, wheezing, SOB CARDIAC: No chest pain, palpitations  No edema. CHEST/BREASTS: No discomfort, discharge or lumps in breasts GI: No abdominal pain  No N/V/D or constipation  No heartburn or reflux multiple very soft bowel movements daily GU: No dysuria, frequency or urgency  No change in urine volume or character No nocturia or change in stream   MUSCULOSKELETAL: No joint pain, swelling or stiffness  No back pain  No muscle ache, pain, weakness  Gait is steady  No recent falls.  NEUROLOGIC: No dizziness, fainting, headache, imbalance, numbness  No change in mental status.  PSYCHIATRIC: No feelings of anxiety, depression Sleeps well.  No behavior issue.    Physical Exam Filed Vitals:   03/28/13 1631  BP: 128/82  Pulse: 88  Height: 5\' 7"  (1.702 m)  Weight: 160 lb (72.576 kg)   Body mass index is 25.05 kg/(m^2).  GENERAL APPEARANCE: No acute distress, appropriately groomed, normal body habitus. Alert, pleasant, conversant. SKIN: No diaphoresis , unusual lesions, wounds. Mild diffuse macular  rash over cheeks, forearms HEAD: Normocephalic, atraumatic EYES: Conjunctiva/lids clear. Pupils round, reactive. Marland Kitchen  EARS: Decreased hearing    NOSE: No deformity or discharge. MOUTH/THROAT: Lips w/o lesions. Oral mucosa, tongue moist, w/o lesion. Oropharynx w/o redness or lesions.  NECK: Supple, full ROM. No thyroid tenderness, enlargement or nodule LYMPHATICS: No head, neck or supraclavicular adenopathy RESPIRATORY: Breathing is even, unlabored. Lung sounds are clear and full.  CARDIOVASCULAR: Heart IRRR. No murmur or extra heart sounds  ARTERIAL: No carotid bruit.   VENOUS: No varicosities. No venous stasis skin changes  EDEMA: No peripheral  edema.  GASTROINTESTINAL: Abdomen is soft, non-tender, not distended w/ normal bowel sounds.  MUSCULOSKELETAL: Moves all extremities with full ROM, strength and tone. Back is without kyphosis, scoliosis or spinal process tenderness. Gait is steady NEUROLOGIC: Oriented to time, place, person.Speech clear, no tremor.  PSYCHIATRIC: Mood and affect appropriate to situation  ASSESSMENT/PLAN  Atrial fibrillation Cardioversion didn't hold, has been in AF for a while, Dr.Ganji is aware and satisfied with rate control/ anticoagulation. Patient does not appear with a volume overload or any signs of heart failure. Doubt his recent shortness of breath has much to do with his atrial fibrillation.  Anxiety state, unspecified Patient's emotional status has been very fragile with spouse's health decline and death. Increased anxiety surrounding these circumstances as well as anticipation of life status change have likely contributed to patient's shortness of breath with activity. He's also been less active in general and perhaps a little bit more deconditioned. Shortness of breath has improved in the last few days, as has his general activity level have encouraged him to continue daily exercise exercise classes as well as stretching regimen on his own. He benefits  significantly from regular visits with Dr. Jen Perkins.   Unspecified constipation Patient has been receiving MiraLax daily to relieve constipation. Appears this dose may be a little much for him reduce to every other day dosing   Time:  40 minutes spent with patient/son, >50% spent counseling/ care coordination  Follow up: As scheduled   Akisha Sturgill T.Korinne Greenstein, NP-C 03/28/2013

## 2013-03-29 ENCOUNTER — Encounter: Payer: Self-pay | Admitting: Geriatric Medicine

## 2013-03-29 DIAGNOSIS — K5900 Constipation, unspecified: Secondary | ICD-10-CM | POA: Insufficient documentation

## 2013-03-29 HISTORY — DX: Constipation, unspecified: K59.00

## 2013-03-29 NOTE — Assessment & Plan Note (Signed)
Cardioversion didn't hold, has been in AF for a while, Dr.Ganji is aware and satisfied with rate control/ anticoagulation. Patient does not appear with a volume overload or any signs of heart failure. Doubt his recent shortness of breath has much to do with his atrial fibrillation.

## 2013-03-29 NOTE — Assessment & Plan Note (Signed)
Patient has been receiving MiraLax daily to relieve constipation. Appears this dose may be a little much for him reduce to every other day dosing

## 2013-03-29 NOTE — Assessment & Plan Note (Signed)
Patient's emotional status has been very fragile with spouse's health decline and death. Increased anxiety surrounding these circumstances as well as anticipation of life status change have likely contributed to patient's shortness of breath with activity. He's also been less active in general and perhaps a little bit more deconditioned. Shortness of breath has improved in the last few days, as has his general activity level have encouraged him to continue daily exercise exercise classes as well as stretching regimen on his own. He benefits significantly from regular visits with Dr. Jen Mow.

## 2013-04-27 ENCOUNTER — Non-Acute Institutional Stay: Payer: Medicare Other | Admitting: Geriatric Medicine

## 2013-04-27 ENCOUNTER — Other Ambulatory Visit (HOSPITAL_COMMUNITY): Payer: Self-pay | Admitting: Internal Medicine

## 2013-04-27 ENCOUNTER — Encounter: Payer: Self-pay | Admitting: Geriatric Medicine

## 2013-04-27 DIAGNOSIS — J3489 Other specified disorders of nose and nasal sinuses: Secondary | ICD-10-CM

## 2013-04-27 DIAGNOSIS — R1310 Dysphagia, unspecified: Secondary | ICD-10-CM

## 2013-04-27 DIAGNOSIS — R0981 Nasal congestion: Secondary | ICD-10-CM

## 2013-04-27 NOTE — Assessment & Plan Note (Signed)
Mild nasal congestion, improved with saline spray today. Mild elevation in blood pressure may be due to use of cold medicines of cold medicine over the last few days. Recommend stopping the Mucinex formulation, use saline spray twice a day and p.r.n. May also use Tylenol if he has  mild headache related to sinus congestion

## 2013-04-27 NOTE — Progress Notes (Signed)
Patient ID: Wyatt Perkins, male   DOB: 12/08/1925, 79 y.o.   MRN: [id number] [facility] (13)  Code Status: DNR  Contact Information   Name Relation Home Work Mobile   Dubuc,Betsy Daughter [phone]        Chief Complaint  Patient presents with  . Nasal Congestion    HPI: This is a 79 y.o. male resident of [facility], Assisted Living  section.  Patient requested evaluation today do to nasal congestion and mildly elevated blood pressure. He began having symptoms of nasal congestion and mild headache earlier this week, his daughter purchased over-the-counter medication for him, Mucinex D Maximum strength. He felt this gave him pretty good relief of his symptoms however earlier today he felt worse, blood pressure was elevated, he had some shortness of breath and requested evaluation.  Nurse initiated standing order for saline nasal spray. On arrival to patients assisted living apartment this afternoon he was fast asleep, he woke up a bit disoriented. Felt he was in a deep sleep before I arrived.   Tells me he continues to feel very sad about the death of his spouse of 64 years, he misses her very much. He also feels under a lot of stress due to paperwork and correspondence.  The patient has been evaluated recently by SLP due to coughing with meals. Recommendation is made for modified barium swallow study, this is scheduled next week.  Allergies  Allergen Reactions  . Simvastatin     myalgias  . Statins     All statins cause muscle cramps   Medications Reviewed  DATA REVIEWED  Laboratory Studies:  [facility] 09/14/2012 BMP: Sodium 122, Potassium 3.5, glucose 102, BUN 12, Creatinine 0.81    10/26/2012 WBC 6.4, hemoglobin 12.7, hematocrit hematocrit 36.5, platelets 222    Glucose 85, BUN 13, creatinine 0.80, sodium 127, potassium 3.0. A 3.8    Total cholesterol 158, triglyceride 84, HDL LDL 108    TSH 2.39   6/96/2952: Glu93, BUN  19. Cr. .81, Na 134, K+ 3.5    CRP <0.5  01/30/2013: WBC 5.9, Hgb 13.7, Hct 39.0, Plt 241  Glu 90, BUN 12 , Cr.80  TSH 5.20  PSA 0.04     Review of Systems  DATA OBTAINED: from patient, nurse, medical record,   GENERAL: Feels "OK"   No fevers, tires easily, no change in appetite or weight SKIN: No itch, rash or open wounds EYES: No eye pain, dryness or itching  No change in vision EARS: No earache, tinnitus, change in hearing NOSE: Mild nasal congestion this afternoon, feels the nasal spray was helpful earlier today.  MOUTH/THROAT: No mouth or tooth pain  No sore throat Denies difficulty chewing or swallowing, admits to occasional coughing with meals RESPIRATORY: No cough, wheezing, Feels SOB with exertion CARDIAC: No chest pain, palpitations  No edema. GI: No abdominal pain  No N/V/D or constipation  No heartburn or reflux multiple very soft bowel movements daily GU: No dysuria, frequency or urgency  No change in urine volume or character No nocturia or change in stream   MUSCULOSKELETAL: No joint pain, swelling or stiffness  No back pain  No muscle ache, pain, weakness  Gait is steady  No recent falls.  NEUROLOGIC: No dizziness, fainting, headache, imbalance, numbness  .  PSYCHIATRIC:  Sleeps well.   Cries frequently due to grief   Physical Exam Filed Vitals:   04/27/13 1606  BP: 145/97  Pulse: 82  SpO2: 97%  GENERAL APPEARANCE: No acute distress, appropriately groomed, normal body habitus. Alert, pleasant, conversant. SKIN: No diaphoresis , unusual lesions, wounds. Mild diffuse macular rash over cheeks, forearms HEAD: Normocephalic, atraumatic EYES: Conjunctiva/lids clear. Pupils round, reactive. Marland Kitchen  EARS: Decreased hearing    NOSE: No deformity or discharge. MOUTH/THROAT: Lips w/o lesions. Oral mucosa, tongue moist, w/o lesion. Oropharynx w/o redness or lesions.  NECK: Supple, full ROM. No thyroid tenderness, enlargement or nodule LYMPHATICS: No head, neck or  supraclavicular adenopathy RESPIRATORY: Breathing is even, unlabored. Lung sounds are clear and full.  CARDIOVASCULAR: Heart RRR. No murmur or extra heart sounds  EDEMA: No peripheral  edema.  MUSCULOSKELETAL: Moves all extremities with full ROM, strength and tone. Back is without kyphosis, scoliosis or spinal process tenderness. Gait is unsteady, better w/ walker NEUROLOGIC: Oriented to time, place, person.Speech clear, no tremor.  PSYCHIATRIC: Mood and affect appropriate to situation  ASSESSMENT/PLAN  No problem-specific assessment & plan notes found for this encounter.   Follow up: As scheduled   Ladean Steinmeyer T.Nerea Bordenave, NP-C 04/27/2013

## 2013-05-01 ENCOUNTER — Ambulatory Visit (HOSPITAL_COMMUNITY): Payer: Medicare Other

## 2013-05-03 ENCOUNTER — Ambulatory Visit (HOSPITAL_COMMUNITY)
Admission: RE | Admit: 2013-05-03 | Discharge: 2013-05-03 | Disposition: A | Discharge status: Home / Self Care | Payer: Medicare Other | Source: Ambulatory Visit | Attending: Internal Medicine | Admitting: Internal Medicine

## 2013-05-03 DIAGNOSIS — R1310 Dysphagia, unspecified: Secondary | ICD-10-CM

## 2013-05-03 NOTE — Procedures (Signed)
Objective Swallowing Evaluation: Modified Barium Swallowing Study  Patient Details  Name: Wyatt Perkins MRN: [id number] Date of Birth: 28-Jan-1926  Today's Date: 05/03/2013 Time: 6295-2841 SLP Time Calculation (min): 54 min  Past Medical History:  Past Medical History  Diagnosis Date  . Dysrhythmia     atrial fibrillation  . Hyperlipemia   . Hypertension   . Open wound of forehead, without mention of complication 11/03/2012  . Loss of weight 10/25/2012  . Debility, unspecified 09/01/2012  . Anxiety state, unspecified 2013  . Atrial fibrillation 2013  . Shortness of breath 2013  . Other atopic dermatitis and related conditions 2013  . Cervicalgia 2013  . Nocturia 2013  . Sebaceous cyst 2012  . Other specified disease of sebaceous glands 2012  . Other bursitis disorders 2012  . Abnormality of gait 2011  . Unspecified hypothyroidism 2011  . Unspecified vitamin D deficiency 2011  . Carpal tunnel syndrome 2011  . Myalgia and myositis, unspecified 2011  . Disturbance of skin sensation 2011  . Urge incontinence 2010  . Malignant neoplasm of prostate [id number]  . Reflux esophagitis 2007  . Lumbago 2007  . GERD (gastroesophageal reflux disease) 12/20/2012  . Rash and other nonspecific skin eruption   . Open wound of forehead, without mention of complication 11/03/2012  . Cataract   . Unspecified constipation 03/29/2013   Past Surgical History:  Past Surgical History  Procedure Laterality Date  . Leg surgery      left  . Cardioversion N/A 10/10/2012    Procedure: CARDIOVERSION;  Surgeon: [name], MD;  Location: [facility] ENDOSCOPY;  Service: Cardiovascular;  Laterality: N/A;  . Appendectomy  2000  . Cataract extraction w/ intraocular lens  implant, bilateral  2007, 2009  . Eye surgery  2007-2009    extraction/IOL implants   HPI:  79 yo male referred by speech pathologist at [facility] for MBS due to concerns pt may be aspirating.  Pt arrived with daughter Wyatt Perkins who is his  HCPOA per pt.  PMH + for anxiety, memory loss, HTN, Vit D deficiency, Afib, esophageal reflux and cognitive-communication deficit.  Pt's symptoms present as delayed cough/clear after solids/liquids, nasal drip/sinus congestion, globus.  Pt states he was not aware of dysphagia until evaluated by speech pathologist and admits to some concerns re: swallow function.   Pt denies weight loss, pulmonary infections nor ever requiring heimlich manuever.  He admits to very occasionally coughing on drinks and pointed to upper chest/esophagus to indicate area of stasis.       Assessment / Plan / Recommendation Clinical Impression  Dysphagia Diagnosis: Mild oral phase dysphagia;Moderate cervical esophageal phase dysphagia;Suspected primary esophageal dysphagia;Mild pharyngeal phase dysphagia  Clinical impression: Mild oropharyngeal and moderate cervical esophageal dysphagia present.  No aspiration or penetration of any consistency tested.  Pt did have a prominent CP with decreased UES opening allowing trapping of all barium consistencies above and below CP with backflow of liquids.  Pt did not sense this stasis/backflow and required cues to conduct dry swallow to help decrease.  Head turn, chin tuck attempted but was not helpful to prevent UES stasis.  Minimal vallecular residuals noted without pt awareness.  Barium tablet taken with thin momentarily lodged below CP - clearing to mid esophagus with further boluses.  Pt took thin warm water to aid transit into stomach.  Retrograde propulsion of liquids in esophagus appeared to thoracic region.     Rec pt continue his diet with strict asp precautions and follow up SLP  for dysphagia management.  Extensive education took place with pt during and with pt and daughter after MBS with written material provided.  Pt also c/o xerostomia, rec consider biotene usage and to start meals with liquids.    Please note after test pt drank water while tipping up bottle with overt cough  - not coughing observed during testing. Throat clear x1 during testing noted without barium seen in larynx.  Suspect pt's symptoms may be primarily due to known GERD and cervical esophageal deficits.      Treatment Recommendation  Defer treatment plan to SLP at (Comment) ([facility])    Diet Recommendation Regular;Thin liquid   Liquid Administration via: Cup;Straw Medication Administration: Whole meds with puree (if small with liquids- follow all po with liquids) Supervision: Patient able to self feed Compensations: Slow rate;Small sips/bites;Follow solids with liquid (intermittent dry swallow, start meal w/liquid,c/o xerostomia) Postural Changes and/or Swallow Maneuvers: Seated upright 90 degrees;Upright 30-60 min after meal    Other  Recommendations Recommended Consults:  (defer to primary SLP for esophageal w/u recommendation) Oral Care Recommendations: Oral care BID (? if Biotene may be helpful)   Follow Up Recommendations   (continued SLP at current venue)           SLP Swallow Goals  n/a defer to primary SLP   General Date of Onset: 05/03/13 HPI: 79 yo male referred by speech pathologist at [facility] for MBS due to concerns pt may be aspirating.  Pt arrived with daughter Wyatt Perkins who is his HCPOA per pt.  PMH + for anxiety, memory loss, HTN, Vit D deficiency, Afib, esophageal reflux and cognitive-communication deficit.  Pt's symptoms present as delayed cough/clear after solids/liquids, nasal drip/sinus congestion, globus.  Pt states he was not aware of dysphagia until evaluated by speech pathologist and admits to some concerns re: swallow function.   Pt denies weight loss, pulmonary infections nor ever requiring heimlich manuever.  He admits to very occasionally coughing on drinks and pointed to upper chest/esophagus to indicate area of stasis.   Type of Study: Modified Barium Swallowing Study Reason for Referral: Objectively evaluate swallowing function Diet Prior to this Study:  Regular;Thin liquids Temperature Spikes Noted: No Respiratory Status: Room air History of Recent Intubation: No Behavior/Cognition: Alert;Cooperative;Pleasant mood Oral Cavity - Dentition: Dentures, top;Dentures, bottom Oral Motor / Sensory Function: Within functional limits Self-Feeding Abilities: Able to feed self Patient Positioning: Upright in chair Baseline Vocal Quality: Clear Volitional Cough: Strong Volitional Swallow: Able to elicit Anatomy:  (pt appears with cervical osteophytes) Pharyngeal Secretions: Not observed secondary MBS    Reason for Referral Objectively evaluate swallowing function   Oral Phase Oral Preparation/Oral Phase Oral Phase: Impaired Oral - Nectar Oral - Nectar Cup: Delayed oral transit Oral - Thin Oral - Thin Cup: Delayed oral transit;Piecemeal swallowing Oral - Thin Straw: Delayed oral transit;Piecemeal swallowing Oral - Solids Oral - Puree: Delayed oral transit Oral - Regular: Delayed oral transit;Piecemeal swallowing Oral - Pill: Delayed oral transit Oral Phase - Comment Oral Phase - Comment: mild delay in oral transit with premature uncontrolled spillage of nectar to vallecular space, no significant oral stasis, piecemeal with cracker and thin WFL   Pharyngeal Phase Pharyngeal Phase Pharyngeal Phase: Impaired Pharyngeal - Nectar Pharyngeal - Nectar Cup: Premature spillage to valleculae;Pharyngeal residue - valleculae;Pharyngeal residue - pyriform sinuses Pharyngeal - Thin Pharyngeal - Thin Cup: Premature spillage to valleculae;Pharyngeal residue - valleculae;Pharyngeal residue - pyriform sinuses Pharyngeal - Thin Straw: Premature spillage to valleculae;Pharyngeal residue - valleculae;Pharyngeal residue - pyriform sinuses  Pharyngeal - Solids Pharyngeal - Puree: Premature spillage to valleculae Pharyngeal - Regular: Premature spillage to valleculae Pharyngeal - Pill: Premature spillage to valleculae  Cervical Esophageal Phase    GO     Cervical Esophageal Phase Cervical Esophageal Phase: Impaired Cervical Esophageal Phase - Nectar Nectar Cup: Reduced cricopharyngeal relaxation;Prominent cricopharyngeal segment;Esophageal backflow into the pharynx Cervical Esophageal Phase - Thin Thin Cup: Reduced cricopharyngeal relaxation;Prominent cricopharyngeal segment;Esophageal backflow into the pharynx Thin Straw: Reduced cricopharyngeal relaxation;Prominent cricopharyngeal segment;Esophageal backflow into the pharynx Cervical Esophageal Phase - Solids Puree: Reduced cricopharyngeal relaxation;Prominent cricopharyngeal segment Regular: Reduced cricopharyngeal relaxation;Prominent cricopharyngeal segment Pill: Reduced cricopharyngeal relaxation;Prominent cricopharyngeal segment (pill lodged below CP WITHOUT pt sensation) Cervical Esophageal Phase - Comment Cervical Esophageal Comment: head turn right and chin tuck attempted to aid CP opening, not effective    Functional Assessment Tool Used: mbs Functional Limitations: Swallowing Swallow Current Status (Z6109): At least 20 percent but less than 40 percent impaired, limited or restricted Swallow Goal Status [phone]): At least 20 percent but less than 40 percent impaired, limited or restricted Swallow Discharge Status [phone]): At least 20 percent but less than 40 percent impaired, limited or restricted   Donavan Burnet, MS [facility] SLP [phone]

## 2013-05-16 ENCOUNTER — Encounter: Payer: Self-pay | Admitting: Geriatric Medicine

## 2013-05-21 ENCOUNTER — Non-Acute Institutional Stay: Payer: Medicare Other | Admitting: Internal Medicine

## 2013-05-21 ENCOUNTER — Encounter: Payer: Self-pay | Admitting: Internal Medicine

## 2013-05-21 VITALS — BP 160/92 | HR 88 | Ht 67.0 in | Wt 163.0 lb

## 2013-05-21 DIAGNOSIS — I5030 Unspecified diastolic (congestive) heart failure: Secondary | ICD-10-CM

## 2013-05-21 DIAGNOSIS — R5381 Other malaise: Secondary | ICD-10-CM

## 2013-05-21 DIAGNOSIS — Z79899 Other long term (current) drug therapy: Secondary | ICD-10-CM

## 2013-05-21 DIAGNOSIS — F4321 Adjustment disorder with depressed mood: Secondary | ICD-10-CM

## 2013-05-21 DIAGNOSIS — K219 Gastro-esophageal reflux disease without esophagitis: Secondary | ICD-10-CM

## 2013-05-21 DIAGNOSIS — I4891 Unspecified atrial fibrillation: Secondary | ICD-10-CM

## 2013-05-21 DIAGNOSIS — F329 Major depressive disorder, single episode, unspecified: Secondary | ICD-10-CM

## 2013-05-21 DIAGNOSIS — R1314 Dysphagia, pharyngoesophageal phase: Secondary | ICD-10-CM

## 2013-05-21 NOTE — Patient Instructions (Addendum)
Continue current medications. I increased your furosemide to reduce edema and to help swelling.

## 2013-05-21 NOTE — Progress Notes (Signed)
Subjective:    Patient ID: Wyatt Perkins, male    DOB: February 22, 1926, 79 y.o.   MRN: [id number]  HPI Patient has been significantly depressed since his wife's death. He admits to feeling lumps in. At the same time, he has been getting out more and believes he is improving. He is frequently tearful when discussing his wife. He seems hopeful that there will be improving emotional stability in the future.  [name] currently weak his son is enrolled him with a Systems analyst. Patient and son feel that he is making progress with this training. He is walking further and more comfortably. He can now walk 200-250 feet.  He is somewhat short of breath with exertion. He saw Dr. Jacinto Halim about 2 weeks ago. Patient has had arrhythmia which was corrected February 2014.  Patient has noted that he seems tired easier and has less stamina. He falls asleep easily. He has some restless nights, but believes he gets enough sleep. He has been using occasional doses of alprazolam 0.25 mg as needed for anxiety.  Patient has a mild elevation in his blood pressures today. He has not been having it checked regularly.   PROCEDURES 05/03/13 MBSS: Discloses mild oral phase dysphagia and moderate cervical esophageal phase dysphagia. There is mild pharyngeal phase dysphagia. No aspiration or penetration with any consistency. Patient had a prominent cricopharyngeal muscle. There was tracking of all barium consistencies above and below the cricopharyngeal muscles with back flow of liquids. Patient did not sense this back flow. Barium tablet taken with thin liquids momentarily lodged below the cricopharyngeus. Patient took thin and warm water to transit into the stomach. There was retrograde propulsion of liquids in the esophagus thoracic region.  It was recommended that the patient continue a diet with strict aspiration precautions and to followup with speech and language therapy for dysphagia management. He should be permitted to  have regular diet with thin liquids. The patient is able to self feed himself. He should use a slow rate of feeding with small sips and bites. Son should be followed by liquids. He should be seated upright at 90 when eating or drinking and upright 30-60 minutes after meals.  Current Outpatient Prescriptions on File Prior to Visit  Medication Sig Dispense Refill  . acetaminophen (TYLENOL) 325 MG tablet Take 650 mg by mouth. Take 2 tablets every 4 hours as needed for headache; take 2 tablets at bedtime      . atenolol-chlorthalidone (TENORETIC) 50-25 MG per tablet Take 1 tablet by mouth daily. Take one daily to control blood pressure.      . Multiple Vitamins-Minerals (MULTIVITAMIN WITH MINERALS) tablet Take 1 tablet by mouth daily. Take one daily as nutritional supplement.      Marland Kitchen omeprazole (PRILOSEC) 20 MG capsule Take 40 mg by mouth daily. Take one tablet once  daily to reduce stomach acid and protect esophagus.      . phenylephrine-shark liver oil-mineral oil-petrolatum (PREPARATION H) 0.25-3-14-71.9 % rectal ointment Place 1 application rectally 2 (two) times daily as needed for hemorrhoids.      . polyethylene glycol (MIRALAX / GLYCOLAX) packet Take 17 g by mouth every other day.       . potassium chloride (MICRO-K) 10 MEQ CR capsule 10 mEq. Take 2 capsule twice a day      . ranitidine (ZANTAC) 75 MG tablet 1 nightly before bed to reduce stomach acid  30 tablet  5  . rivaroxaban (XARELTO) 10 MG TABS tablet Take 20 mg by  mouth daily. Take one daily for anticoagulation.      . sodium chloride (OCEAN) 0.65 % nasal spray Place 1 spray into the nose as needed for congestion.      . triamcinolone cream (KENALOG) 0.1 % Apply sparingly to rash daily until resolved.  45 g  0  . vitamin C (ASCORBIC ACID) 500 MG tablet Take 500 mg by mouth daily.       No current facility-administered medications on file prior to visit.    Review of Systems  Constitutional: Positive for activity change, appetite  change and fatigue. Negative for fever, chills, diaphoresis and unexpected weight change.  HENT: Positive for hearing loss. Negative for ear pain, nosebleeds, congestion, facial swelling, rhinorrhea, neck pain, neck stiffness, tinnitus and ear discharge.   Eyes: Negative.   Respiratory:       Dyspnea with exertion.  Cardiovascular: Negative for chest pain, palpitations and leg swelling.       History of cardiac arrhythmia; currently asymptomatic.  Gastrointestinal: Positive for diarrhea. Negative for abdominal pain, constipation, abdominal distention and anal bleeding.       History of dysphagia. Cough sometimes drinking fluids. Has had a modified barium swallowing study. It showed impairment in the pharyngeal phase and cervical esophageal phase of swallowing. There was pharyngeal phase pharyngeal residue. There was reduced cricopharyngeal relaxation.  Endocrine: Negative.   Skin:       Rash is resolved.  Allergic/Immunologic: Negative.   Neurological: Positive for numbness.  Hematological: Negative.   Psychiatric/Behavioral: The patient is nervous/anxious.        Grief reaction with moderate depression.       Objective:BP 160/92  Pulse 88  Ht 5\' 7"  (1.702 m)  Wt 163 lb (73.936 kg)  BMI 25.52 kg/m2    Physical Exam  Nursing note and vitals reviewed. Constitutional: He is oriented to person, place, and time. He appears well-developed and well-nourished. No distress.  HENT:  Head: Normocephalic and atraumatic.  Mouth/Throat: Oropharynx is clear and moist. No oropharyngeal exudate.  Loss of hearing  Neck: Normal range of motion. Neck supple. No JVD present. No tracheal deviation present. No thyromegaly present.  Cardiovascular: Normal rate, normal heart sounds and intact distal pulses.  Exam reveals no gallop and no friction rub.   No murmur heard. AF.  Pulmonary/Chest: Effort normal and breath sounds normal. No respiratory distress. He has no wheezes.  Abdominal: Soft. Bowel  sounds are normal. He exhibits no distension and no mass. There is no tenderness.  Musculoskeletal: Normal range of motion. He exhibits no edema and no tenderness.  Unstable gait.  Lymphadenopathy:    He has no cervical adenopathy.  Neurological: He is alert and oriented to person, place, and time. He has normal reflexes. No cranial nerve deficit. Coordination normal.  Skin: He is not diaphoretic.  Continues with a mild rash, but it is virtually completely gone. He continues to use steroid cream on this area.  Psychiatric: He has a normal mood and affect. His behavior is normal. Judgment and thought content normal.  Tearful at times when discussing his wife.          Assessment & Plan:  Dysphagia, pharyngoesophageal phase: Patient will continue to work with speech and language therapy.  GERD (gastroesophageal reflux disease): Asymptomatic.  Unresolved grief: Lengthy discussion about depression. He seems to be responding in an appropriate fashion in regards to his grief over his wife's death. He had been spending extraordinarily large amount of time attending to the patient initially,  and then visiting her in the memory area where she ultimately died.  Atrial fibrillation: Improved  Unspecified diastolic heart failure, manifested by increased leg swelling and shortness of breath. Furosemide was increased.  Other malaise and fatigue: Increased problems with congestive heart failure may be contributing to shortness of breath and this fatigability. Hopefully this will improve by increasing his diuretic. His grief reaction I also enter into the situation with a sense of weakness accompanying his depression.

## 2013-05-23 ENCOUNTER — Encounter: Payer: Self-pay | Admitting: Geriatric Medicine

## 2013-05-24 DIAGNOSIS — Z79899 Other long term (current) drug therapy: Secondary | ICD-10-CM | POA: Insufficient documentation

## 2013-05-24 DIAGNOSIS — I5030 Unspecified diastolic (congestive) heart failure: Secondary | ICD-10-CM | POA: Insufficient documentation

## 2013-05-24 DIAGNOSIS — F4321 Adjustment disorder with depressed mood: Secondary | ICD-10-CM | POA: Insufficient documentation

## 2013-05-24 DIAGNOSIS — R5381 Other malaise: Secondary | ICD-10-CM | POA: Insufficient documentation

## 2013-05-24 DIAGNOSIS — R1314 Dysphagia, pharyngoesophageal phase: Secondary | ICD-10-CM | POA: Insufficient documentation

## 2013-06-18 ENCOUNTER — Encounter: Payer: Self-pay | Admitting: Internal Medicine

## 2013-06-18 ENCOUNTER — Non-Acute Institutional Stay: Payer: Medicare Other | Admitting: Internal Medicine

## 2013-06-18 VITALS — BP 92/60 | HR 84 | Ht 67.0 in | Wt 145.0 lb

## 2013-06-18 DIAGNOSIS — F329 Major depressive disorder, single episode, unspecified: Secondary | ICD-10-CM

## 2013-06-18 DIAGNOSIS — R1314 Dysphagia, pharyngoesophageal phase: Secondary | ICD-10-CM

## 2013-06-18 DIAGNOSIS — F4321 Adjustment disorder with depressed mood: Secondary | ICD-10-CM

## 2013-06-18 DIAGNOSIS — E039 Hypothyroidism, unspecified: Secondary | ICD-10-CM

## 2013-06-18 DIAGNOSIS — I5030 Unspecified diastolic (congestive) heart failure: Secondary | ICD-10-CM

## 2013-06-18 DIAGNOSIS — I4891 Unspecified atrial fibrillation: Secondary | ICD-10-CM

## 2013-06-18 DIAGNOSIS — R634 Abnormal weight loss: Secondary | ICD-10-CM

## 2013-06-18 DIAGNOSIS — R21 Rash and other nonspecific skin eruption: Secondary | ICD-10-CM

## 2013-06-18 NOTE — Progress Notes (Signed)
Subjective:    Patient ID: Wyatt Perkins, male    DOB: [birth year], 79 y.o.   MRN: [id number]  Chief Complaint  Patient presents with  . Medical Managment of Chronic Issues    4 week follow-up on Depression, Fatigue and Dysphagia     HPI Unresolved grief: Improving  Unspecified diastolic heart failure: Improved since furosemide was increased to 40 mg daily. Weight dropped from 163 pounds on 05/21/13 to 145 pounds today. He is breathing easier.  Atrial fibrillation: Rate controlled  Dysphagia, pharyngoesophageal phase: Improved  Loss of weight: Desirable fluid overload in lateSeptember  Rash and other nonspecific skin eruption: Resolved  Unspecified hypothyroidism: Stable  Hematoma in the right hip: Improved    Current Outpatient Prescriptions on File Prior to Visit  Medication Sig Dispense Refill  . acetaminophen (TYLENOL) 325 MG tablet Take 650 mg by mouth. Take 2 tablets every 6 hours as needed for headache; take 2 tablets at bedtime      . ALPRAZolam (XANAX) 0.25 MG tablet Take one every 8 hours as needed for anxiety      . antiseptic oral rinse (BIOTENE) LIQD 15 mLs by Mouth Rinse route as needed. Before meals      . atenolol-chlorthalidone (TENORETIC) 50-25 MG per tablet Take 1 tablet by mouth daily. Take one daily to control blood pressure.      . furosemide (LASIX) 20 MG tablet Take one tablet for leg edema/SOB as needed      . Multiple Vitamins-Minerals (MULTIVITAMIN WITH MINERALS) tablet Take 1 tablet by mouth daily. Take one daily as nutritional supplement.      Marland Kitchen omeprazole (PRILOSEC) 20 MG capsule Take 40 mg by mouth daily. Take one tablet once  daily to reduce stomach acid and protect esophagus.      . phenylephrine-shark liver oil-mineral oil-petrolatum (PREPARATION H) 0.25-3-14-71.9 % rectal ointment Place 1 application rectally 2 (two) times daily as needed for hemorrhoids.      . polyethylene glycol (MIRALAX / GLYCOLAX) packet Take 17 g by mouth every other  day.       . ranitidine (ZANTAC) 75 MG tablet 1 nightly before bed to reduce stomach acid  30 tablet  5  . rivaroxaban (XARELTO) 10 MG TABS tablet Take 20 mg by mouth daily. Take one daily for anticoagulation.      . sodium chloride (OCEAN) 0.65 % nasal spray Place 1 spray into the nose as needed for congestion.      . triamcinolone cream (KENALOG) 0.1 % Apply sparingly to rash daily until resolved.  45 g  0  . vitamin C (ASCORBIC ACID) 500 MG tablet Take 500 mg by mouth daily.       No current facility-administered medications on file prior to visit.    Review of Systems  Constitutional: Positive for activity change, appetite change and fatigue. Negative for fever, chills, diaphoresis and unexpected weight change.  HENT: Positive for hearing loss. Negative for congestion, ear discharge, ear pain, facial swelling, nosebleeds, rhinorrhea and tinnitus.   Eyes: Negative.   Respiratory:       Dyspnea with exertion.  Cardiovascular: Negative for chest pain, palpitations and leg swelling.       History of cardiac arrhythmia; currently asymptomatic.  Gastrointestinal: Positive for diarrhea. Negative for abdominal pain, constipation, abdominal distention and anal bleeding.       History of dysphagia. Cough sometimes drinking fluids. Has had a modified barium swallowing study. It showed impairment in the pharyngeal phase and cervical esophageal phase  of swallowing. There was pharyngeal phase pharyngeal residue. There was reduced cricopharyngeal relaxation.  Endocrine: Negative.   Musculoskeletal: Negative for neck pain and neck stiffness.  Skin:       Rash is resolved.  Allergic/Immunologic: Negative.   Neurological: Positive for numbness.  Hematological: Negative.   Psychiatric/Behavioral: The patient is nervous/anxious.        Grief reaction with moderate depression.       Objective:BP 92/60  Pulse 84  Ht 5\' 7"  (1.702 m)  Wt 145 lb (65.772 kg)  BMI 22.71 kg/m2    Physical Exam   Nursing note and vitals reviewed. Constitutional: He is oriented to person, place, and time. He appears well-developed and well-nourished. No distress.  HENT:  Head: Normocephalic and atraumatic.  Mouth/Throat: Oropharynx is clear and moist. No oropharyngeal exudate.  Loss of hearing  Neck: Normal range of motion. Neck supple. No JVD present. No tracheal deviation present. No thyromegaly present.  Cardiovascular: Normal rate, normal heart sounds and intact distal pulses.  Exam reveals no gallop and no friction rub.   No murmur heard. AF.  Pulmonary/Chest: Effort normal and breath sounds normal. No respiratory distress. He has no wheezes.  Abdominal: Soft. Bowel sounds are normal. He exhibits no distension and no mass. There is no tenderness.  Musculoskeletal: Normal range of motion. He exhibits no edema and no tenderness.  Unstable gait.  Lymphadenopathy:    He has no cervical adenopathy.  Neurological: He is alert and oriented to person, place, and time. He has normal reflexes. No cranial nerve deficit. Coordination normal.  Skin: He is not diaphoretic.  Continues with a mild rash, but it is virtually completely gone. He continues to use steroid cream on this area.  Psychiatric: He has a normal mood and affect. His behavior is normal. Judgment and thought content normal.  Tearful at times when discussing his wife.     LAB REVIEW 01/30/13 CBC: nl  BMP; Na 131,, otherwise nl  TSH 5.201  PSA 0.04  Vit D 36 05/29/13 BMP: BUN 16, creat 0.87  BNP 512     Assessment & Plan:  Unresolved grief: Improving  Unspecified diastolic heart failure: Improving. Furosemide will be reduced to 20 mg daily.  Atrial fibrillation: Rate control.  Dysphagia, pharyngoesophageal phase: Stable  Loss of weight: Desirable based on fluid overload in late September  Rash and other nonspecific skin eruption: Resolved  Unspecified hypothyroidism: Stable

## 2013-07-02 NOTE — Patient Instructions (Signed)
Reduce furosemide to 20 mg daily

## 2013-07-04 ENCOUNTER — Non-Acute Institutional Stay: Payer: Medicare Other | Admitting: Geriatric Medicine

## 2013-07-04 VITALS — BP 118/68 | HR 76 | Temp 96.3°F | Ht 67.0 in | Wt 147.0 lb

## 2013-07-04 DIAGNOSIS — J029 Acute pharyngitis, unspecified: Secondary | ICD-10-CM

## 2013-07-04 DIAGNOSIS — E876 Hypokalemia: Secondary | ICD-10-CM

## 2013-07-04 NOTE — Progress Notes (Signed)
Patient ID: Wyatt Perkins, male   DOB: 31-Oct-1925, 79 y.o.   MRN: [id number] [facility] [phone])  Code Status:  Contact Information   Name Relation Home Work Mobile   Halladay,Betsy Daughter [phone]         Chief Complaint  Patient presents with  . Sore Throat    sore and red for 2-4 days. Difficulty swallowing pills with applesauce.  With son Para March today     HPI: This is a 79 y.o. male resident of [facility],   Assisted Living  section.  Evaluation is requested today due to sore throat.  Patient reports he has had a sore throat for the past 2-4 days, Nurse reports he has difficulty swallowing pills. Feels like "karate chop to the larynx".  He does not recall any specific trauma however does recall having some difficulty swallowing a large potassium pills several days ago.  Patient has been evaluated recently by cardiology, diuretic and potassium supplement have been adjusted.    Allergies  Allergen Reactions  . Simvastatin     myalgias  . Statins     All statins cause muscle cramps   Medications Reviewed  DATA REVIEWED  Radiologic Exams 05/03/13 MBSS: Discloses mild oral phase dysphagia and moderate cervical esophageal phase dysphagia. There is mild pharyngeal phase dysphagia. No aspiration or penetration with any consistency. Patient had a prominent cricopharyngeal muscle. There was tracking of all barium consistencies above and below the cricopharyngeal muscles with back flow of liquids. Patient did not sense this back flow. Barium tablet taken with thin liquids momentarily lodged below the cricopharyngeus. Patient took thin and warm water to transit into the stomach. There was retrograde propulsion of liquids in the esophagus thoracic region.  It was recommended that the patient continue a diet with strict aspiration precautions and to followup with speech and language therapy for dysphagia management. He should be permitted to  have regular diet with thin liquids. The patient is able to self feed himself. He should use a slow rate of feeding with small sips and bites. Son should be followed by liquids. He should be seated upright at 90 when eating or drinking and upright 30-60 minutes after meals.     Cardiovascular Exams:   Laboratory Studies  [facility] 10/26/2012 WBC 6.4, hemoglobin 12.7, hematocrit hematocrit 36.5, platelets 222               Glucose 85, BUN 13, creatinine 0.80, sodium 127, potassium 3.0. A 3.8               Total cholesterol 158, triglyceride 84, HDL LDL 108               TSH 9.56   12/07/2012: Glu93, BUN 19. Cr. .81, Na 134, K+ 3.5               CRP <0.5  01/30/2013: WBC 5.9, Hgb 13.7, Hct 39.0, Plt 241             Glu 90, BUN 12 , Cr.80             TSH 5.20             PSA 0.04 05/29/2013 glucose 91, BUN 16, creatinine 0.87, sodium 135, potassium 3.4  BNP 512.5 06/28/2013 glucose 91, BUN 19, creatinine 0.78, sodium 134, potassium 3.3      Review of Systems   DATA OBTAINED: from patient, nurse, medical record, family member (son,Duncan) GENERAL: Feels well  No fevers, fatigue, change in appetite or weight EARS: No earache, tinnitus, change in hearing NOSE: No congestion, drainage or bleeding MOUTH/THROAT: No mouth or tooth pain  Sore throat - see HPI   RESPIRATORY: No cough, wheezing, SOB CARDIAC: No chest pain, palpitations  No edema. PSYCHIATRIC: No feelings of anxiety, depression Sleeps well.  No behavior issue.     Physical Exam Filed Vitals:   07/04/13 1335  BP: 118/68  Pulse: 76  Temp: 96.3 F (35.7 C)  TempSrc: Oral  Height: 5\' 7"  (1.702 m)  Weight: 147 lb (66.679 kg)   Body mass index is 23.02 kg/(m^2).  GENERAL APPEARANCE: No acute distress, appropriately groomed, normal body habitus. Alert, pleasant, conversant. SKIN: No diaphoresis, rash, unusual lesions, wounds HEAD: Normocephalic, atraumatic EYES: Conjunctiva/lids clear. NOSE: No deformity or  discharge. MOUTH/THROAT: Lips w/o lesions. Oral mucosa, tongue moist, w/o lesion. Oropharynx w/o redness or lesions.  NECK: Supple, full ROM. No thyroid tenderness, enlargement or nodule LYMPHATICS: No head, neck or supraclavicular adenopathy RESPIRATORY: Breathing is even, unlabored. Lung sounds are clear and full.  CARDIOVASCULAR: Heart RRR. No murmur or extra heart sounds  MUSCULOSKELETAL: Gait is steady w/ walker NEUROLOGIC: Oriented to time, place, person.  PSYCHIATRIC: Mood and affect appropriate to situation  ASSESSMENT/PLAN  Sore throat Sore throat last several days, no sign of viral or bacterial infection. Suspect this patient had an uncoated potassium pill stick in esophagus, start to melt and cause irritation. He is reporting this discomfort is getting better. Patient requires continued potassium supplementation. Due to mild pharyngeal  and cervical esophageal dysphagia change formulation to a powder that can be mixed with water to avoid swallowing large pills.    Follow up: As scheduled  Accalia Rigdon T.Khaleed Holan, NP-C 07/04/2013

## 2013-07-08 DIAGNOSIS — E876 Hypokalemia: Secondary | ICD-10-CM | POA: Insufficient documentation

## 2013-07-08 DIAGNOSIS — J029 Acute pharyngitis, unspecified: Secondary | ICD-10-CM | POA: Insufficient documentation

## 2013-07-08 MED ORDER — POTASSIUM CHLORIDE CRYS ER 20 MEQ PO TBCR: 20.0000 meq | EXTENDED_RELEASE_TABLET | Freq: Every day | ORAL | Status: DC

## 2013-07-08 NOTE — Assessment & Plan Note (Signed)
Sore throat last several days, no sign of viral or bacterial infection. Suspect this patient had an uncoated potassium pill stick in esophagus, start to melt and cause irritation. He is reporting this discomfort is getting better. Patient requires continued potassium supplementation. Due to mild pharyngeal  and cervical esophageal dysphagia change formulation to a powder that can be mixed with water to avoid swallowing large pills.

## 2013-08-06 ENCOUNTER — Encounter: Payer: Self-pay | Admitting: Internal Medicine

## 2013-08-06 ENCOUNTER — Non-Acute Institutional Stay: Payer: Medicare Other | Admitting: Internal Medicine

## 2013-08-06 VITALS — BP 114/62 | HR 72 | Ht 64.5 in | Wt 148.0 lb

## 2013-08-06 DIAGNOSIS — F4321 Adjustment disorder with depressed mood: Secondary | ICD-10-CM

## 2013-08-06 DIAGNOSIS — R1314 Dysphagia, pharyngoesophageal phase: Secondary | ICD-10-CM

## 2013-08-06 DIAGNOSIS — E039 Hypothyroidism, unspecified: Secondary | ICD-10-CM

## 2013-08-06 DIAGNOSIS — F329 Major depressive disorder, single episode, unspecified: Secondary | ICD-10-CM

## 2013-08-06 DIAGNOSIS — I4891 Unspecified atrial fibrillation: Secondary | ICD-10-CM

## 2013-08-06 DIAGNOSIS — I5030 Unspecified diastolic (congestive) heart failure: Secondary | ICD-10-CM

## 2013-08-06 DIAGNOSIS — R634 Abnormal weight loss: Secondary | ICD-10-CM

## 2013-08-06 DIAGNOSIS — E876 Hypokalemia: Secondary | ICD-10-CM

## 2013-08-06 NOTE — Progress Notes (Signed)
Patient ID: Wyatt Perkins, male   DOB: 04-Oct-1925, 79 y.o.   MRN: [id number]    Nursing Home Location:  [facility] , room [address] [place]  Place of Service: Clinic (12)  PCP: Wyatt Relic, MD  Code Status: Living will  Allergies  Allergen Reactions  . Simvastatin     myalgias  . Statins     All statins cause muscle cramps    Chief Complaint  Patient presents with  . Medical Managment of Chronic Issues    Comprehensive exam: blood pressure, thyroid, cholesterol . 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  Wakes at night with tingling down the left arm. Better if he shifts in bed. Sometimes does better if he gets up and moves around. Sleeping on a wedge. thinking of buying a machine from [employer] that warms and massages hands. Phs Therapy has recommnded exercises to loosen the shoulders.   Atrial fibrillation: rate controlled  Unspecified diastolic heart failure: compensated  Dysphagia, pharyngoesophageal phase:improved  Unspecified hypothyroidism: compensated  Loss of weight:improved  Unresolved grief: improved  Hypokalemia: resolved     Past Medical History  Diagnosis Date  . Dysrhythmia     atrial fibrillation  . Hyperlipemia   . Hypertension   . Open wound of forehead, without mention of complication 11/03/2012  . Loss of weight 10/25/2012  . Debility, unspecified 09/01/2012  . Anxiety state, unspecified 2013  . Atrial fibrillation 2013  . Shortness of breath 2013  . Other atopic dermatitis and related conditions 2013  . Cervicalgia 2013  . Nocturia 2013  . Sebaceous cyst 2012  . Other specified disease of sebaceous glands 2012  . Other bursitis disorders 2012  . Abnormality of gait 2011  . Unspecified hypothyroidism 2011  . Unspecified vitamin D deficiency 2011  . Carpal tunnel syndrome 2011  . Myalgia and myositis, unspecified 2011  . Disturbance of skin sensation 2011  . Urge  incontinence 2010  . Malignant neoplasm of prostate [id number]  . Reflux esophagitis 2007  . Lumbago 2007  . GERD (gastroesophageal reflux disease) 12/20/2012  . Rash and other nonspecific skin eruption   . Open wound of forehead, without mention of complication 11/03/2012  . Cataract   . Unspecified constipation 03/29/2013    Past Surgical History  Procedure Laterality Date  . Leg surgery      left  . Cardioversion N/A 10/10/2012    Procedure: CARDIOVERSION;  Surgeon: Wyatt Pert, MD;  [facility] ENDOSCOPY;  Service: Cardiovascular;  Laterality: N/A;  . Appendectomy  2000  . Cataract extraction w/ intraocular lens  implant, bilateral  2007, 2009  . Eye surgery  2007-2009    extraction/IOL implants    CONSULTANTS Neurosurgery: Wy[facility] Derm: Wyatt [facility] Ophth: [facility]att [facility]; [facility] Cardio: Wyatt Perkins Surg: Wyatt Perkins Hand Surg: Wyatt Perkins  PAST PROCEDURES 2004 colonoscopy Nuclear stress test 2006 CT CS  Social History: History   Social History  . Marital Status: Widowed    Spouse Name: N/A    Number of Children: N/A  . Years of Education: N/A   Social History Main Topics  . Smoking status: Former Smoker    Quit date: 12/26/1958  . Smokeless tobacco: Never Used  . Alcohol Use: 1.2 oz/week    2 Glasses of wine per week     Comment: 2 -3x /week  . Drug Use: No  . Sexual Activity: No   Other Topics Concern  . None  Social History Narrative   [name] Widowed (02/2013), was married to Wyatt Perkins since 1951. Retired from [employer].  [facility], [place] apartment in same community [place]  2008.Marland Kitchen   Stopped smoking 1960s.. Drinks moderate alcohol (wine)   Patient has  Advanced planning documents: Living Will, DNR                Family History Family Status  Relation Status Death Age  . Mother Deceased 36  . Father Deceased 83  . Sister Deceased   . Brother  Deceased   . Daughter Alive   . Son Alive   . Brother Deceased   . Daughter Alive   . Son Alive    Family History  Problem Relation Age of Onset  . Alzheimer's disease Mother   . Cancer Father     lung  . Cancer Sister   . Cancer Brother   . Cancer Brother      Medications: Patient's Medications  New Prescriptions   No medications on file  Previous Medications   ACETAMINOPHEN (TYLENOL) 325 MG TABLET    Take 650 mg by mouth. Take 2 tablets every 6 hours as needed for headache; take 2 tablets at bedtime   ALPRAZOLAM (XANAX) 0.25 MG TABLET    Take one every 8 hours as needed for anxiety   ANTISEPTIC ORAL RINSE (BIOTENE) LIQD    15 mLs by Mouth Rinse route as needed. Before meals   ATENOLOL-CHLORTHALIDONE (TENORETIC) 50-25 MG PER TABLET    Take 1 tablet by mouth daily. Take one daily to control blood pressure.   BENZOCAINE 3 MG LOZG    Use as directed in the mouth or throat. Dissolve in mouth slowly, one every 2 hours as needed, not to exceed 6 in 24 hour period   FUROSEMIDE (LASIX) 20 MG TABLET    20 mg. Take one tablet each morning   FUROSEMIDE (LASIX) 40 MG TABLET    Take 40 mg by mouth. Take one if weight is >150lbs for 3 days. Then decrease back to 20mg    HOMEOPATHIC PRODUCTS (ARNICARE) GEL    Apply topically. Apply thin layer to right hip and right buttocks up to three times daily as needed   MULTIPLE VITAMINS-MINERALS (MULTIVITAMIN WITH MINERALS) TABLET    Take 1 tablet by mouth daily. Take one daily as nutritional supplement.   OMEPRAZOLE (PRILOSEC) 20 MG CAPSULE    Take 40 mg by mouth daily. Take one tablet once  daily to reduce stomach acid and protect esophagus.   PHENYLEPHRINE-SHARK LIVER OIL-MINERAL OIL-PETROLATUM (PREPARATION H) 0.25-3-14-71.9 % RECTAL OINTMENT    Place 1 application rectally 2 (two) times daily as needed for hemorrhoids.   POLYETHYLENE GLYCOL (MIRALAX / GLYCOLAX) PACKET    Take 17 g by mouth every other day.    POTASSIUM CHLORIDE SA (K-DUR,KLOR-CON) 20  MEQ TABLET    Take 1 tablet (20 mEq total) by mouth daily. Mix 20 mEq in 4 ounces of water daily   RANITIDINE (ZANTAC) 75 MG TABLET    1 nightly before bed to reduce stomach acid   RIVAROXABAN (XARELTO) 10 MG TABS TABLET    Take 20 mg by mouth daily. Take one daily for anticoagulation.   SODIUM CHLORIDE (OCEAN) 0.65 % NASAL SPRAY    Place 1 spray into the nose as needed for congestion.   SPIRONOLACTONE (ALDACTONE) 25 MG TABLET    Take 25 mg by mouth  daily.   TRIAMCINOLONE CREAM (KENALOG) 0.1 %    Apply sparingly to rash daily until resolved.   VITAMIN C (ASCORBIC ACID) 500 MG TABLET    Take 500 mg by mouth daily.  Modified Medications   No medications on file  Discontinued Medications   No medications on file    Immunization History  Administered Date(s) Administered  . Influenza-Unspecified 06/13/2013  . PPD Test 10/10/2012  . Pneumococcal Polysaccharide-23 02/08/2013  . Tdap 02/08/2013     Review of Systems  Constitutional: Negative for weight loss, malaise/fatigue and diaphoresis.  HENT: Positive for hearing loss.   Respiratory: Negative for cough, sputum production and shortness of breath.        History of DOE. Now improved.  Cardiovascular: Negative.  Negative for chest pain, palpitations, orthopnea, claudication, leg swelling and PND.  Gastrointestinal: Negative for heartburn.       History of dysphagia. Improved  Genitourinary:       Slow, weak stream. History of Ca prostate  Musculoskeletal: Positive for joint pain and myalgias. Negative for back pain, falls and neck pain.       Poor balance. Using 4 wheel walker. Chronic low back pain.  Neurological: Positive for tingling (down arms). Negative for tremors, sensory change, speech change, focal weakness, seizures and weakness.  Endo/Heme/Allergies: Negative.   Psychiatric/Behavioral: Negative for depression and suicidal ideas. The patient is nervous/anxious.      Filed Vitals:   08/06/13 1512  BP: 114/62  Pulse:  72  Height: 5' 4.5" (1.638 m)  Weight: 148 lb (67.132 kg)   Physical Exam  Nursing note and vitals reviewed. Constitutional: He is oriented to person, place, and time. He appears well-developed and well-nourished. No distress.  HENT:  Head: Normocephalic and atraumatic.  Mouth/Throat: Oropharynx is clear and moist. No oropharyngeal exudate.  Loss of hearing. Dentures  Eyes:  Corrective lenses  Neck: Normal range of motion. Neck supple. No JVD present. No tracheal deviation present. No thyromegaly present.  Cardiovascular: Normal rate, normal heart sounds and intact distal pulses.  Exam reveals no gallop and no friction rub.   No murmur heard. AF.  Pulmonary/Chest: Effort normal and breath sounds normal. No respiratory distress. He has no wheezes.  Abdominal: Soft. Bowel sounds are normal. He exhibits no distension and no mass. There is no tenderness.  Musculoskeletal: Normal range of motion. He exhibits no edema and no tenderness.  Unstable gait.  Lymphadenopathy:    He has no cervical adenopathy.  Neurological: He is alert and oriented to person, place, and time. He has normal reflexes. No cranial nerve deficit. Coordination normal.  Skin: He is not diaphoretic.  Continues with a mild rash, but it is virtually completely gone. He continues to use steroid cream on this area.  Psychiatric: He has a normal mood and affect. His behavior is normal. Judgment and thought content normal.  Tearful at times when discussing his wife.      Labs reviewed: 01/30/13 CBC: nl  BMP; Na 131,, otherwise nl  TSH 5.201  PSA 0.04  Vit D 36  05/29/13 BMP: BUN 16, creat 0.87  BNP 512 10/30 14 BMP: K+ 3.3  2006 CT CS IMPRESSION:   1. Multilevel cervical spondylosis is greatest from C3 to C7.    2. Grade I anterolisthesis C3 on 4 and C7 on T1.    3. Mild retrolisthesis C6 on C7.   4. Central canal narrowing is greatest at C6-7.      Assessment/Plan 1. Atrial fibrillation  Rate controlled  2.  Unspecified diastolic heart failure compensated  3. Dysphagia, pharyngoesophageal phase improved  4. Unspecified hypothyroidism compensated  5. Loss of weight improved  6. Unresolved grief over his wife's death Improved.  7. Hypokalemia resolved

## 2013-09-21 ENCOUNTER — Other Ambulatory Visit: Payer: Self-pay | Admitting: Dermatology

## 2013-11-12 ENCOUNTER — Non-Acute Institutional Stay: Payer: Medicare Other | Admitting: Internal Medicine

## 2013-11-12 ENCOUNTER — Encounter: Payer: Self-pay | Admitting: Internal Medicine

## 2013-11-12 VITALS — BP 110/60 | HR 64 | Wt 149.0 lb

## 2013-11-12 DIAGNOSIS — I4891 Unspecified atrial fibrillation: Secondary | ICD-10-CM

## 2013-11-12 DIAGNOSIS — R142 Eructation: Secondary | ICD-10-CM

## 2013-11-12 DIAGNOSIS — E871 Hypo-osmolality and hyponatremia: Secondary | ICD-10-CM

## 2013-11-12 DIAGNOSIS — R634 Abnormal weight loss: Secondary | ICD-10-CM

## 2013-11-12 DIAGNOSIS — I5030 Unspecified diastolic (congestive) heart failure: Secondary | ICD-10-CM

## 2013-11-12 DIAGNOSIS — R1314 Dysphagia, pharyngoesophageal phase: Secondary | ICD-10-CM

## 2013-11-12 DIAGNOSIS — R141 Gas pain: Secondary | ICD-10-CM

## 2013-11-12 DIAGNOSIS — R143 Flatulence: Secondary | ICD-10-CM

## 2013-11-12 DIAGNOSIS — E039 Hypothyroidism, unspecified: Secondary | ICD-10-CM

## 2013-11-12 DIAGNOSIS — K219 Gastro-esophageal reflux disease without esophagitis: Secondary | ICD-10-CM

## 2013-11-12 DIAGNOSIS — M19049 Primary osteoarthritis, unspecified hand: Secondary | ICD-10-CM

## 2013-11-12 MED ORDER — POLYETHYLENE GLYCOL 3350 17 G PO PACK: PACK | ORAL | Status: DC

## 2013-11-12 MED ORDER — NIZATIDINE 150 MG PO CAPS: ORAL_CAPSULE | ORAL | Status: DC

## 2013-11-12 NOTE — Progress Notes (Signed)
Patient ID: Wyatt Perkins, male   DOB: [birth year], 80 y.o.   MRN: [id number]    Location:  [facility] (12)    Allergies  Allergen Reactions  . Simvastatin     myalgias  . Statins     All statins cause muscle cramps    Chief Complaint  Patient presents with  . Medical Managment of Chronic Issues    A-Fib, thyroid, blood pressure, anxiety . With daughter Bryson Ha    HPI:   Atrial fibrillation: Chronic. Rate controlled. Asymptomatic.  Unspecified diastolic heart failure: Compensated  Unspecified hypothyroidism: Followup lab as needed  Dysphagia, pharyngoesophageal phase: Currently asymptomatic.  Loss of weight: Weight stable for several months  Hyponatremia: Followup lab needed. Was 130 mEq when last checked.  Flatulence: Patient says this is only mildly troublesome. He thinks it may be related to the MiraLax. He is ambivalent about whether he wants to stop the MiraLax.    Medications: Patient's Medications  New Prescriptions   No medications on file  Previous Medications   ACETAMINOPHEN (TYLENOL) 325 MG TABLET    Take 650 mg by mouth. Take 2 tablets every 6 hours as needed for headache; take 2 tablets at bedtime   ANTISEPTIC ORAL RINSE (BIOTENE) LIQD    15 mLs by Mouth Rinse route as needed. Before meals   ATENOLOL-CHLORTHALIDONE (TENORETIC) 50-25 MG PER TABLET    Take 1 tablet by mouth daily. Take one daily to control blood pressure.   BENZOCAINE 3 MG LOZG    Use as directed in the mouth or throat. Dissolve in mouth slowly, one every 2 hours as needed, not to exceed 6 in 24 hour period   FUROSEMIDE (LASIX) 20 MG TABLET    20 mg. Take one tablet each morning   FUROSEMIDE (LASIX) 40 MG TABLET    Take 40 mg by mouth. Take one if weight is >150lbs for 3 days. Then decrease back to 20mg    HOMEOPATHIC PRODUCTS (ARNICARE) GEL    Apply topically. Apply thin layer to right hip and right buttocks up to three times daily as needed    MULTIPLE VITAMINS-MINERALS (MULTIVITAMIN WITH MINERALS) TABLET    Take 1 tablet by mouth daily. Take one daily as nutritional supplement.   OMEPRAZOLE (PRILOSEC) 20 MG CAPSULE    Take 40 mg by mouth daily. Take one tablet once  daily to reduce stomach acid and protect esophagus.   PHENYLEPHRINE-SHARK LIVER OIL-MINERAL OIL-PETROLATUM (PREPARATION H) 0.25-3-14-71.9 % RECTAL OINTMENT    Place 1 application rectally 2 (two) times daily as needed for hemorrhoids.   POLYETHYLENE GLYCOL (MIRALAX / GLYCOLAX) PACKET    Take 17 g by mouth every other day.    POTASSIUM CHLORIDE SA (K-DUR,KLOR-CON) 20 MEQ TABLET    Take 1 tablet (20 mEq total) by mouth daily. Mix 20 mEq in 4 ounces of water daily   RANITIDINE (ZANTAC) 75 MG TABLET    1 nightly before bed to reduce stomach acid   RIVAROXABAN (XARELTO) 10 MG TABS TABLET    Take 20 mg by mouth daily. Take one daily for anticoagulation.   SODIUM CHLORIDE (OCEAN) 0.65 % NASAL SPRAY    Place 1 spray into the nose as needed for congestion.   SPIRONOLACTONE (ALDACTONE) 25 MG TABLET    Take 25 mg by mouth daily.   TRIAMCINOLONE CREAM (KENALOG) 0.1 %    Apply sparingly to rash daily until resolved.   VITAMIN C (ASCORBIC ACID) 500 MG TABLET  Take 500 mg by mouth daily.  Modified Medications   No medications on file  Discontinued Medications   ALPRAZOLAM (XANAX) 0.25 MG TABLET    Take one every 8 hours as needed for anxiety     Review of Systems  Constitutional: Positive for fatigue. Negative for fever, chills, diaphoresis, activity change, appetite change and unexpected weight change.  HENT: Positive for hearing loss. Negative for congestion, ear discharge, ear pain, facial swelling, nosebleeds, rhinorrhea and tinnitus.   Eyes: Negative.   Respiratory:       Dyspnea with exertion.  Cardiovascular: Negative for chest pain, palpitations and leg swelling.       History of cardiac arrhythmia; currently asymptomatic.  Gastrointestinal: Negative for abdominal  pain, diarrhea, constipation, abdominal distention and anal bleeding.       History of dysphagia. Had a modified barium swallowing study. It showed impairment in the pharyngeal phase and cervical esophageal phase of swallowing. There was pharyngeal phase pharyngeal residue. There was reduced cricopharyngeal relaxation. Currently he is asymptomatic.  Endocrine: Negative.   Musculoskeletal: Negative for neck pain and neck stiffness.       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  Allergic/Immunologic: Negative.   Neurological: Positive for numbness.  Hematological: Negative.   Psychiatric/Behavioral: The patient is nervous/anxious.        Grief reaction with moderate depression has resolved.    Filed Vitals:   11/12/13 1350  BP: 110/60  Pulse: 64  Weight: 149 lb (67.586 kg)   Physical Exam  Nursing note and vitals reviewed. Constitutional: He is oriented to person, place, and time. He appears well-developed and well-nourished. No distress.  HENT:  Head: Normocephalic and atraumatic.  Mouth/Throat: Oropharynx is clear and moist. No oropharyngeal exudate.  Loss of hearing. Dentures  Eyes:  Corrective lenses  Neck: Normal range of motion. Neck supple. No JVD present. No tracheal deviation present. No thyromegaly present.  Cardiovascular: Normal rate, normal heart sounds and intact distal pulses.  Exam reveals no gallop and no friction rub.   No murmur heard. AF.  Pulmonary/Chest: Effort normal and breath sounds normal. No respiratory distress. He has no wheezes.  Abdominal: Soft. Bowel sounds are normal. He exhibits no distension and no mass. There is no tenderness.  Musculoskeletal: Normal range of motion. He exhibits no edema and no tenderness.  Unstable gait.  Lymphadenopathy:    He has no cervical adenopathy.  Neurological: He is alert and oriented to person, place, and time. He has normal reflexes. No cranial nerve deficit. Coordination  normal.  Skin: No rash noted. He is not diaphoretic. No erythema. No pallor.  Psychiatric: He has a normal mood and affect. His behavior is normal. Judgment and thought content normal.     Labs reviewed: 01/30/13 CBC: nl  BMP; Na 131,, otherwise nl  TSH 5.201  PSA 0.04  Vit D 36  05/29/13 BMP: BUN 16, creat 0.87  BNP 512  06/28/13 BMP: K+ 3.3     Assessment/Plan  1. Atrial fibrillation Stable. Rate controlled.  2. Unspecified diastolic heart failure Decompensated  3. Unspecified hypothyroidism TSH  4. Dysphagia, pharyngoesophageal phase Asymptomatic  5. Loss of weight Stable  6. Hyponatremia Followup lab  7. Flatulence Discontinue every other day use of MiraLax since he is suspicious that this is creating a flatulence problem - polyethylene glycol (MIRALAX / GLYCOLAX) packet; 17 g in 8 ounces of fluid daily as needed for constipation.  Dispense: 14 each; Refill: 5  8.  GERD (gastroesophageal reflux disease) Discontinue omeprazole - nizatidine (AXID) 150 MG capsule; One twice daily to reduce stomach acid  Dispense: 60 capsule; Refill: 5  9. Osteoarthritis of hand Advised to try starting more Myoflex or Aspercreme

## 2013-11-14 ENCOUNTER — Other Ambulatory Visit: Payer: Self-pay | Admitting: Geriatric Medicine

## 2013-11-14 MED ORDER — ARNICARE EX GEL: 1.0000 "application " | CUTANEOUS | Status: DC | PRN

## 2013-11-14 MED ORDER — ARNICA MONTANA PO PLLT: 5.0000 | PELLET | ORAL | Status: DC

## 2013-11-22 LAB — BASIC METABOLIC PANEL
BUN: 24 mg/dL — AB (ref 4–21)
Creatinine: 1.1 mg/dL (ref 0.6–1.3)
Glucose: 96 mg/dL
Potassium: 3.7 mmol/L (ref 3.4–5.3)
SODIUM: 136 mmol/L — AB (ref 137–147)

## 2013-12-04 ENCOUNTER — Encounter: Payer: Self-pay | Admitting: Internal Medicine

## 2013-12-24 ENCOUNTER — Encounter: Payer: Self-pay | Admitting: Internal Medicine

## 2013-12-24 ENCOUNTER — Non-Acute Institutional Stay: Payer: Medicare Other | Admitting: Internal Medicine

## 2013-12-24 VITALS — BP 100/60 | HR 68 | Temp 97.3°F | Wt 151.0 lb

## 2013-12-24 DIAGNOSIS — M24549 Contracture, unspecified hand: Secondary | ICD-10-CM

## 2013-12-24 MED ORDER — TROLAMINE SALICYLATE 10 % EX CREA: TOPICAL_CREAM | CUTANEOUS | Status: DC

## 2013-12-24 NOTE — Progress Notes (Signed)
Patient ID: Wyatt Perkins, male   DOB: 25-Jul-1926, 80 y.o.   MRN: [id number]    Location:  [facility]   Place of Service: OFFICE    Allergies  Allergen Reactions  . Simvastatin     myalgias  . Statins     All statins cause muscle cramps    Chief Complaint  Patient presents with  . index finger    left hand is drawing up, can't straighten finger for past 10 days. 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 Contracture seems to be progressing rapidly over the last couple of months. There is pain at the PIP and MCP joints of the index finger.   Medications: Patient's Medications  New Prescriptions   No medications on file  Previous Medications   ACETAMINOPHEN (TYLENOL) 325 MG TABLET    Take 650 mg by mouth. Take 2 tablets every 6 hours as needed for headache; take 2 tablets at bedtime   ANTISEPTIC ORAL RINSE (BIOTENE) LIQD    15 mLs by Mouth Rinse route as needed. Before meals   ATENOLOL-CHLORTHALIDONE (TENORETIC) 50-25 MG PER TABLET    Take 1 tablet by mouth daily. Take one daily to control blood pressure.   BENZOCAINE 3 MG LOZG    Use as directed in the mouth or throat. Dissolve in mouth slowly, one every 2 hours as needed, not to exceed 6 in 24 hour period   FUROSEMIDE (LASIX) 20 MG TABLET    20 mg. Take one tablet each morning   FUROSEMIDE (LASIX) 40 MG TABLET    Take 40 mg by mouth. Take one if weight is >150lbs for 3 days. Then decrease back to 20mg    HOMEOPATHIC PRODUCTS ([place]) PLLT    Take 5 each by mouth as directed. Take 5 tabs 3 times daily for 5 days then 5 pills once daily to reduce pain re: OA   HOMEOPATHIC PRODUCTS (ARNICARE) GEL    Apply 1 application topically as needed. Apply thin layer to right hip and right buttocks up to three times daily as needed. MAy apply to tender joints as needed to reduce pain re: OA   MULTIPLE VITAMINS-MINERALS (MULTIVITAMIN WITH MINERALS) TABLET     Take 1 tablet by mouth daily. Take one daily as nutritional supplement.   NIZATIDINE (AXID) 150 MG CAPSULE    One twice daily to reduce stomach acid   PHENYLEPHRINE-SHARK LIVER OIL-MINERAL OIL-PETROLATUM (PREPARATION H) 0.25-3-14-71.9 % RECTAL OINTMENT    Place 1 application rectally 2 (two) times daily as needed for hemorrhoids.   POLYETHYLENE GLYCOL (MIRALAX / GLYCOLAX) PACKET    17 g in 8 ounces of fluid daily as needed for constipation.   POTASSIUM CHLORIDE SA (K-DUR,KLOR-CON) 20 MEQ TABLET    Take 1 tablet (20 mEq total) by mouth daily. Mix 20 mEq in 4 ounces of water daily   RIVAROXABAN (XARELTO) 10 MG TABS TABLET    Take 20 mg by mouth daily. Take one daily for anticoagulation.   SODIUM CHLORIDE (OCEAN) 0.65 % NASAL SPRAY    Place 1 spray into the nose as needed for congestion.   SPIRONOLACTONE (ALDACTONE) 25 MG TABLET    Take 25 mg by mouth daily.   TRIAMCINOLONE CREAM (KENALOG) 0.1 %    Apply sparingly to rash daily until resolved.   VITAMIN C (ASCORBIC ACID) 500 MG TABLET    Take 500 mg by mouth daily.  Modified Medications   No medications on file  Discontinued Medications   No medications on file     Review of Systems  Constitutional: Positive for fatigue. Negative for fever, chills, diaphoresis, activity change, appetite change and unexpected weight change.  HENT: Positive for hearing loss. Negative for congestion, ear discharge, ear pain, facial swelling, nosebleeds, rhinorrhea and tinnitus.   Eyes: Negative.   Respiratory:       Dyspnea with exertion.  Cardiovascular: Negative for chest pain, palpitations and leg swelling.       History of cardiac arrhythmia; currently asymptomatic.  Gastrointestinal: Negative for abdominal pain, diarrhea, constipation, abdominal distention and anal bleeding.       History of dysphagia. Had a modified barium swallowing study. It showed impairment in the pharyngeal phase and cervical esophageal phase of swallowing. There was pharyngeal phase  pharyngeal residue. There was reduced cricopharyngeal relaxation. Currently he is asymptomatic.  Endocrine: Negative.   Musculoskeletal: Negative for neck pain and neck stiffness.       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  Allergic/Immunologic: Negative.   Neurological: Positive for numbness.  Hematological: Negative.   Psychiatric/Behavioral: The patient is nervous/anxious.        Grief reaction with moderate depression has resolved.    Filed Vitals:   12/24/13 1605  BP: 100/60  Pulse: 68  Temp: 97.3 F (36.3 C)  TempSrc: Oral  Weight: 151 lb (68.493 kg)   Physical Exam  Nursing note and vitals reviewed. Constitutional: He is oriented to person, place, and time. He appears well-developed and well-nourished. No distress.  HENT:  Head: Normocephalic and atraumatic.  Mouth/Throat: Oropharynx is clear and moist. No oropharyngeal exudate.  Loss of hearing. Dentures  Eyes:  Corrective lenses  Neck: Normal range of motion. Neck supple. No JVD present. No tracheal deviation present. No thyromegaly present.  Cardiovascular: Normal rate, normal heart sounds and intact distal pulses.  Exam reveals no gallop and no friction rub.   No murmur heard. AF.  Pulmonary/Chest: Effort normal and breath sounds normal. No respiratory distress. He has no wheezes.  Abdominal: Soft. Bowel sounds are normal. He exhibits no distension and no mass. There is no tenderness.  Musculoskeletal: Normal range of motion. He exhibits no edema and no tenderness.  Unstable gait. Contracture of the left index finger. Pain on palpation  Lymphadenopathy:    He has no cervical adenopathy.  Neurological: He is alert and oriented to person, place, and time. He has normal reflexes. No cranial nerve deficit. Coordination normal.  Skin: No rash noted. He is not diaphoretic. No erythema. No pallor.  Psychiatric: He has a normal mood and affect. His behavior is normal. Judgment and  thought content normal.      Assessment/Plan  Contracture of finger joint - Plan: Ambulatory referral to Hand Surgery, trolamine salicylate (MYOFLEX) 10 % cream

## 2014-07-01 ENCOUNTER — Ambulatory Visit
Admission: RE | Admit: 2014-07-01 | Discharge: 2014-07-01 | Disposition: A | Discharge status: Home / Self Care | Payer: Medicare Other | Source: Ambulatory Visit | Attending: Cardiology | Admitting: Cardiology

## 2014-07-01 ENCOUNTER — Other Ambulatory Visit: Payer: Self-pay | Admitting: Cardiology

## 2014-07-01 DIAGNOSIS — R0602 Shortness of breath: Secondary | ICD-10-CM

## 2014-08-12 ENCOUNTER — Encounter: Payer: Self-pay | Admitting: Internal Medicine

## 2014-08-12 ENCOUNTER — Non-Acute Institutional Stay: Payer: Medicare Other | Admitting: Internal Medicine

## 2014-08-12 VITALS — BP 110/60 | HR 64 | Temp 97.3°F | Ht 64.0 in | Wt 154.0 lb

## 2014-08-12 DIAGNOSIS — R1314 Dysphagia, pharyngoesophageal phase: Secondary | ICD-10-CM

## 2014-08-12 DIAGNOSIS — I482 Chronic atrial fibrillation, unspecified: Secondary | ICD-10-CM

## 2014-08-12 DIAGNOSIS — M24542 Contracture, left hand: Secondary | ICD-10-CM

## 2014-08-12 DIAGNOSIS — E039 Hypothyroidism, unspecified: Secondary | ICD-10-CM

## 2014-08-12 NOTE — Progress Notes (Signed)
Patient ID: Wyatt Perkins, male   DOB: 01-Dec-1925, 80 y.o.   MRN: [id number]    [facility] Room Number: [id number]  Place of Service: Clinic (12) OFFICE   Allergies  Allergen Reactions  . Simvastatin     myalgias  . Statins     All statins cause muscle cramps    Chief Complaint  Patient presents with  . Annual Exam    Comprehensive exam: blood pressure, thyroid, A-Fib, anxiety    HPI:  Chronic atrial fibrillation: rate controlled. On Xarelto.  Hypothyroidism, unspecified hypothyroidism type: compensated  Contracture of finger joint, left: using splints. Has seen Dr, Sudie Bailey.   Dysphagia, pharyngoesophageal phase: improved    Medications: Patient's Medications  New Prescriptions   No medications on file  Previous Medications   ACETAMINOPHEN (TYLENOL) 325 MG TABLET    Take 650 mg by mouth. Take 2 tablets every 6 hours as needed for headache; take 2 tablets at bedtime   ANTISEPTIC ORAL RINSE (BIOTENE) LIQD    15 mLs by Mouth Rinse route as needed. Before meals   ATENOLOL-CHLORTHALIDONE (TENORETIC) 50-25 MG PER TABLET    Take 1 tablet by mouth daily. Take one daily to control blood pressure.   BENZOCAINE 3 MG LOZG    Use as directed in the mouth or throat. Dissolve in mouth slowly, one every 2 hours as needed, not to exceed 6 in 24 hour period   FUROSEMIDE (LASIX) 20 MG TABLET    20 mg. Take one tablet each morning   FUROSEMIDE (LASIX) 40 MG TABLET    Take 40 mg by mouth. Take one if weight is >150lbs for 3 days. Then decrease back to 20mg    HOMEOPATHIC PRODUCTS ([place]) PLLT    Take 5 each by mouth as directed. Take 5 tabs 3 times daily for 5 days then 5 pills once daily to reduce pain re: OA   HOMEOPATHIC PRODUCTS (ARNICARE) GEL    Apply 1 application topically as needed. Apply thin layer to right hip and right buttocks up to three times daily as needed. MAy apply to tender joints as needed to reduce pain re: OA   MULTIPLE  VITAMINS-MINERALS (MULTIVITAMIN WITH MINERALS) TABLET    Take 1 tablet by mouth daily. Take one daily as nutritional supplement.   NAPROXEN SODIUM (ALEVE) 220 MG TABLET    Take 220 mg by mouth 2 (two) times daily with a meal.   PHENYLEPHRINE-SHARK LIVER OIL-MINERAL OIL-PETROLATUM (PREPARATION H) 0.25-3-14-71.9 % RECTAL OINTMENT    Place 1 application rectally 2 (two) times daily as needed for hemorrhoids.   POLYETHYLENE GLYCOL (MIRALAX / GLYCOLAX) PACKET    17 g in 8 ounces of fluid daily as needed for constipation.   POTASSIUM CHLORIDE SA (K-DUR,KLOR-CON) 20 MEQ TABLET    Take 1 tablet (20 mEq total) by mouth daily. Mix 20 mEq in 4 ounces of water daily   RANITIDINE (ZANTAC) 150 MG TABLET    Take one twice daily for stomach acid   RIVAROXABAN (XARELTO) 10 MG TABS TABLET    Take 20 mg by mouth daily. Take one daily for anticoagulation.   SODIUM CHLORIDE (OCEAN) 0.65 % NASAL SPRAY    Place 1 spray into the nose as needed for congestion.   SPIRONOLACTONE (ALDACTONE) 25 MG TABLET    Take 25 mg by mouth daily.   TRIAMCINOLONE CREAM (KENALOG) 0.1 %    Apply sparingly to rash daily until resolved.   TROLAMINE SALICYLATE (MYOFLEX) 10 %  CREAM    Apply 4 times daily to painful finger   VITAMIN C (ASCORBIC ACID) 500 MG TABLET    Take 500 mg by mouth daily.  Modified Medications   No medications on file  Discontinued Medications   NIZATIDINE (AXID) 150 MG CAPSULE    One twice daily to reduce stomach acid     Review of Systems  Constitutional: Positive for fatigue. Negative for fever, chills, diaphoresis, activity change, appetite change and unexpected weight change.  HENT: Positive for hearing loss. Negative for congestion, ear discharge, ear pain, facial swelling, nosebleeds, rhinorrhea and tinnitus.   Eyes: Negative.   Respiratory:       Dyspnea with exertion.  Cardiovascular: Negative for chest pain, palpitations and leg swelling.       History of cardiac arrhythmia; currently asymptomatic.    Gastrointestinal: Negative for abdominal pain, diarrhea, constipation, abdominal distention and anal bleeding.       History of dysphagia. Had a modified barium swallowing study. It showed impairment in the pharyngeal phase and cervical esophageal phase of swallowing. There was pharyngeal phase pharyngeal residue. There was reduced cricopharyngeal relaxation. Currently he is asymptomatic.  Endocrine: Negative.   Musculoskeletal: Negative for neck pain and neck stiffness.       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  Allergic/Immunologic: Negative.   Neurological: Positive for numbness.  Hematological: Negative.   Psychiatric/Behavioral: The patient is nervous/anxious.        Grief reaction with moderate depression has resolved.    Filed Vitals:   08/12/14 1454  BP: 110/60  Pulse: 64  Temp: 97.3 F (36.3 C)  TempSrc: Oral  Height: 5\' 4"  (1.626 m)  Weight: 154 lb (69.854 kg)  SpO2: 94%   Body mass index is 26.42 kg/(m^2).  Physical Exam  Constitutional: He is oriented to person, place, and time. He appears well-developed and well-nourished. No distress.  HENT:  Head: Normocephalic and atraumatic.  Mouth/Throat: Oropharynx is clear and moist. No oropharyngeal exudate.  Loss of hearing. Dentures  Eyes:  Corrective lenses  Neck: Normal range of motion. Neck supple. No JVD present. No tracheal deviation present. No thyromegaly present.  Cardiovascular: Normal rate, normal heart sounds and intact distal pulses.  Exam reveals no gallop and no friction rub.   No murmur heard. AF.  Pulmonary/Chest: Effort normal and breath sounds normal. No respiratory distress. He has no wheezes.  Abdominal: Soft. Bowel sounds are normal. He exhibits no distension and no mass. There is no tenderness.  Musculoskeletal: Normal range of motion. He exhibits no edema or tenderness.  Unstable gait. Contracture of the left index finger. Pain on palpation   Lymphadenopathy:    He has no cervical adenopathy.  Neurological: He is alert and oriented to person, place, and time. He has normal reflexes. No cranial nerve deficit. Coordination normal.  Skin: No rash noted. He is not diaphoretic. No erythema. No pallor.  Psychiatric: He has a normal mood and affect. His behavior is normal. Judgment and thought content normal.  Nursing note and vitals reviewed.    Labs reviewed: No results found for any previous visit.   Assessment/Plan  1. Chronic atrial fibrillation Rate controlled; anticoagulated with xarelto  2. Hypothyroidism, unspecified hypothyroidism type compensated  3. Contracture of finger joint, left continue with Dr. Sudie Bailey. He has stopped the component cream with Ibuprofen, baclofen, lidocaine, and gabapentin.  4. Dysphagia, pharyngoesophageal phase improved

## 2014-10-16 ENCOUNTER — Encounter: Payer: Medicare Other | Admitting: Nurse Practitioner

## 2014-10-17 ENCOUNTER — Non-Acute Institutional Stay: Payer: Medicare Other | Admitting: Adult Health

## 2014-10-17 DIAGNOSIS — R21 Rash and other nonspecific skin eruption: Secondary | ICD-10-CM | POA: Diagnosis not present

## 2014-10-17 DIAGNOSIS — L0292 Furuncle, unspecified: Secondary | ICD-10-CM

## 2014-10-17 NOTE — Progress Notes (Signed)
Patient ID: Wyatt Perkins, male   DOB: [birth year], 81 y.o.   MRN: [id number]  Nursing Home Location:  [place] of Service: ALF [phone])  Chief Complaint  Patient presents with  . Acute Visit    right leg furuncle, rash    HPI:   81 y.o.  residing at [facility], living in assisted living. I was asked to see him today to evaluate a furuncle noted to the right lower leg. The area apparently drained purulent fluid during his shower yesterday. The area is red and tender but there has been no fever, malaise, or decreased appetite. He also reports that his back itches on a regular basis. He is not on any new meds or and has not had a change in soaps or other personal products. He recently was seen by dermatology and a bx was performed on an area of the RLE, not related to the area in question.   Review of Systems:  Review of Systems  Constitutional: Negative for fever, chills, diaphoresis, activity change, appetite change and fatigue.  Skin: Positive for rash and wound.       Itching to back. Drainage to RLE wound. H/O AK, SK, and SCC  Hematological: Bruises/bleeds easily.    Medications: Patient's Medications  New Prescriptions   No medications on file  Previous Medications   ACETAMINOPHEN (TYLENOL) 325 MG TABLET    Take 650 mg by mouth. Take 2 tablets every 6 hours as needed for headache; take 2 tablets at bedtime   ANTISEPTIC ORAL RINSE (BIOTENE) LIQD    15 mLs by Mouth Rinse route as needed. Before meals   ATENOLOL-CHLORTHALIDONE (TENORETIC) 50-25 MG PER TABLET    Take 1 tablet by mouth daily. Take one daily to control blood pressure.   BENZOCAINE 3 MG LOZG    Use as directed in the mouth or throat. Dissolve in mouth slowly, one every 2 hours as needed, not to exceed 6 in 24 hour period   FUROSEMIDE (LASIX) 20 MG TABLET    20 mg. Take one tablet each morning   FUROSEMIDE (LASIX) 40 MG TABLET    Take 40 mg by mouth. Take one if  weight is >150lbs for 3 days. Then decrease back to 20mg    HOMEOPATHIC PRODUCTS ([place]) PLLT    Take 5 each by mouth as directed. Take 5 tabs 3 times daily for 5 days then 5 pills once daily to reduce pain re: OA   HOMEOPATHIC PRODUCTS (ARNICARE) GEL    Apply 1 application topically as needed. Apply thin layer to right hip and right buttocks up to three times daily as needed. MAy apply to tender joints as needed to reduce pain re: OA   MULTIPLE VITAMINS-MINERALS (MULTIVITAMIN WITH MINERALS) TABLET    Take 1 tablet by mouth daily. Take one daily as nutritional supplement.   NAPROXEN SODIUM (ALEVE) 220 MG TABLET    Take 220 mg by mouth 2 (two) times daily with a meal.   PHENYLEPHRINE-SHARK LIVER OIL-MINERAL OIL-PETROLATUM (PREPARATION H) 0.25-3-14-71.9 % RECTAL OINTMENT    Place 1 application rectally 2 (two) times daily as needed for hemorrhoids.   POLYETHYLENE GLYCOL (MIRALAX / GLYCOLAX) PACKET    17 g in 8 ounces of fluid daily as needed for constipation.   POTASSIUM CHLORIDE SA (K-DUR,KLOR-CON) 20 MEQ TABLET    Take 1 tablet (20 mEq total) by mouth daily. Mix 20 mEq in 4 ounces of water daily  RANITIDINE (ZANTAC) 150 MG TABLET    Take one twice daily for stomach acid   RIVAROXABAN (XARELTO) 10 MG TABS TABLET    Take 20 mg by mouth daily. Take one daily for anticoagulation.   SODIUM CHLORIDE (OCEAN) 0.65 % NASAL SPRAY    Place 1 spray into the nose as needed for congestion.   SPIRONOLACTONE (ALDACTONE) 25 MG TABLET    Take 25 mg by mouth daily.   TRIAMCINOLONE CREAM (KENALOG) 0.1 %    Apply sparingly to rash daily until resolved.   TROLAMINE SALICYLATE (MYOFLEX) 10 % CREAM    Apply 4 times daily to painful finger   VITAMIN C (ASCORBIC ACID) 500 MG TABLET    Take 500 mg by mouth daily.  Modified Medications   No medications on file  Discontinued Medications   No medications on file     Physical Exam:  Filed Vitals:   10/17/14 1512  BP: 128/71  Pulse: 77  Temp: 97.9 F (36.6 C)    Resp: 16  SpO2: 97%    Physical Exam  Constitutional: He is oriented to person, place, and time and well-developed, well-nourished, and in no distress. No distress.  Neurological: He is alert and oriented to person, place, and time.  Skin: Skin is warm and dry. He is not diaphoretic.  Minimal macular erythematous rash to back with no open areas. Right lower leg to medial aspect of the knee 1 cm furuncle noted with surrounding erythema no drainage. Tenderness on palpation. Bx site to right lower leg lateral aspect WNL  Psychiatric: Affect normal.      Assessment/Plan  1. Furuncle Previously drained in the shower. Begin Bactroban BID for 7 days  2. Rash and nonspecific skin eruption Triamcinolone 0.1% BID for 7 days   Cindi Carbon, ANP [facility] [phone]

## 2014-11-21 ENCOUNTER — Telehealth: Payer: Self-pay | Admitting: *Deleted

## 2014-11-21 NOTE — Telephone Encounter (Signed)
Daughter is going to talk with patient first before just stopping it. Will call back.

## 2014-11-21 NOTE — Telephone Encounter (Signed)
[facility], daughter called and wants to know if the Ranitidine has the same side effects as the Prilosec with memory loss and deceasing memory. Patient was taken off the Prilosec in the past due to this but is still on the Ranitidine so daughter wants to know if this medication has the same side effect. Please Advise.

## 2014-11-21 NOTE — Telephone Encounter (Signed)
Cognitive impairment is listed as a potential side effect of ranitidine. We can stop this drug and see if his GERD becomes a problem with indigestion. If she is agreeable to stopping it, please contact [facility] assisted living unit to discontinue the ranitidine.

## 2014-12-10 ENCOUNTER — Non-Acute Institutional Stay: Payer: Medicare Other | Admitting: Internal Medicine

## 2014-12-10 ENCOUNTER — Encounter: Payer: Self-pay | Admitting: Internal Medicine

## 2014-12-10 VITALS — BP 108/62 | HR 68 | Temp 97.7°F | Wt 155.0 lb

## 2014-12-10 DIAGNOSIS — K219 Gastro-esophageal reflux disease without esophagitis: Secondary | ICD-10-CM

## 2014-12-10 DIAGNOSIS — E039 Hypothyroidism, unspecified: Secondary | ICD-10-CM

## 2014-12-10 DIAGNOSIS — I482 Chronic atrial fibrillation, unspecified: Secondary | ICD-10-CM

## 2014-12-10 DIAGNOSIS — Z23 Encounter for immunization: Secondary | ICD-10-CM

## 2014-12-10 DIAGNOSIS — G5601 Carpal tunnel syndrome, right upper limb: Secondary | ICD-10-CM

## 2014-12-10 DIAGNOSIS — G3184 Mild cognitive impairment, so stated: Secondary | ICD-10-CM

## 2014-12-10 DIAGNOSIS — G5602 Carpal tunnel syndrome, left upper limb: Secondary | ICD-10-CM

## 2014-12-10 DIAGNOSIS — G5603 Carpal tunnel syndrome, bilateral upper limbs: Secondary | ICD-10-CM

## 2014-12-10 NOTE — Progress Notes (Signed)
Patient ID: ESVIN HNAT, male   DOB: 19-Oct-1925, 81 y.o.   MRN: [id number]   Location:  [facility]  Code Status: DNR Goals of Care: Advanced Directive information Does patient have an advance directive?: Yes, Type of Advance Directive: [place];Living will;Out of facility DNR (pink MOST or yellow form), Pre-existing out of facility DNR order (yellow form or pink MOST form): Yellow form placed in chart (order not valid for inpatient use), Does patient want to make changes to advanced directive?: No - Patient declined   Allergies  Allergen Reactions  . Simvastatin     myalgias  . Statins     All statins cause muscle cramps    Chief Complaint  Patient presents with  . Medical Management of Chronic Issues    Here with daughter [name], wants to meet Dr. Mariea Clonts and discuss his heartburn medication and it might cause decrease memory.     HPI: [name] a 81 y.o. white male seen in the office today for med mgt of chronic diseases.    All family had heard about NPR discussion about PPI possibly worsening memory loss.  Had been on prilosec initially.  Question was does ranitidine have same effect.  Ranitidine was discontinued.  He was doing fine as far as his heartburn when taking it.  It was then resumed due to heartburn problems.  Even now, has a little bit of heartburn.  Did previously have significant heartburn and eventually had become a nonproblem.  Discussed how ranitidine can take a while to be fully effective.    Lives in [place].  Says he's had a wonderful life and "it's time to go.  It's not that I want to go."  Had 4 children.  Very active physically--golfing.  Wife passed away a year and 1/2 ago.  A lot of twins in the family.  He's the only living of his siblings.  Has a lot of family here.    Denies pain aside from the heartburn.  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  Surgeon recommended immediate surgery.  Has had some  steroid shots which worked short term.  Hard for him to pick up a fork, drops items.  Uses exercises, splints at night.  No current neck problems.  Sleeps on wedge pillow--sleeps very well.  Back is riddled with arthritis.  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  Needs regular stretching.  Has lost height.    Sees Dr. Wandalee Ferdinand as therapist.   Review of Systems:  Review of Systems  Constitutional: Negative for fever and chills.  Eyes: Negative for blurred vision.  Respiratory: Negative for shortness of breath.   Cardiovascular: Negative for chest pain.  Gastrointestinal: Positive for heartburn. Negative for nausea, vomiting, abdominal pain, constipation, blood in stool and melena.  Musculoskeletal: Positive for back pain. Negative for falls.       CTS  Skin: Negative for rash.  Neurological: Negative for dizziness.  Endo/Heme/Allergies: Bruises/bleeds easily.       Loss of ht  Psychiatric/Behavioral: Positive for memory loss. Negative for depression.     Past Medical History  Diagnosis Date  . Dysrhythmia     atrial fibrillation  . Hyperlipemia   . Hypertension   . Open wound of forehead, without mention of complication 02/04/1244  . Loss of weight 10/25/2012  . Debility, unspecified 09/01/2012  . Anxiety state, unspecified 2013  . Atrial fibrillation 2013  .  Shortness of breath 2013  . Other atopic dermatitis and related conditions 2013  . Cervicalgia 2013  . Nocturia 2013  . Sebaceous cyst 2012  . Other specified disease of sebaceous glands 2012  . Other bursitis disorders 2012  . Abnormality of gait 2011  . Unspecified hypothyroidism 2011  . Unspecified vitamin D deficiency 2011  . Carpal tunnel syndrome 2011  . Myalgia and myositis, unspecified 2011  . Disturbance of skin sensation 2011  . Urge incontinence 2010  . Malignant neoplasm of prostate [id number]  . Reflux esophagitis 2007  . Lumbago 2007  . GERD (gastroesophageal reflux disease) 12/20/2012  .  Rash and other nonspecific skin eruption   . Open wound of forehead, without mention of complication 09/01/7251  . Cataract   . Unspecified constipation 03/29/2013    Past Surgical History  Procedure Laterality Date  . Leg surgery      left  . Cardioversion N/A 10/10/2012    Procedure: CARDIOVERSION;  Surgeon: Laverda Page, MD;  Location: [place];  Service: Cardiovascular;  Laterality: N/A;  . Appendectomy  2000  . Cataract extraction w/ intraocular lens  implant, bilateral  2007, 2009  . Eye surgery  2007-2009    extraction/IOL implants    Social History:   reports that he quit smoking about 55 years ago. He has never used smokeless tobacco. He reports that he drinks about 0.6 oz of alcohol per week. He reports that he does not use illicit drugs.  Family History  Problem Relation Age of Onset  . Alzheimer's disease Mother   . Cancer Father     lung  . Cancer Sister   . Cancer Brother   . Cancer Brother     Medications: Patient's Medications  New Prescriptions   No medications on file  Previous Medications   ACETAMINOPHEN (TYLENOL) 325 MG TABLET    Take 650 mg by mouth. Take 2 tablets every 6 hours as needed for headache; take 2 tablets at bedtime   ANTISEPTIC ORAL RINSE (BIOTENE) LIQD    15 mLs by Mouth Rinse route as needed. Before meals   ATENOLOL-CHLORTHALIDONE (TENORETIC) 50-25 MG PER TABLET    Take 1 tablet by mouth daily. Take one daily to control blood pressure.   BENZOCAINE 3 MG LOZG    Use as directed in the mouth or throat. Dissolve in mouth slowly, one every 2 hours as needed, not to exceed 6 in 24 hour period   FUROSEMIDE (LASIX) 20 MG TABLET    20 mg. Take one tablet each morning   FUROSEMIDE (LASIX) 40 MG TABLET    Take 40 mg by mouth. Take one if weight is >150lbs for 3 days. Then decrease back to 20mg    HOMEOPATHIC PRODUCTS (ARNICA [place]) PLLT    Take 5 each by mouth as directed. Take 5 tabs 3 times daily for 5 days then 5 pills once daily to  reduce pain re: OA   HOMEOPATHIC PRODUCTS (ARNICARE) GEL    Apply 1 application topically as needed. Apply thin layer to right hip and right buttocks up to three times daily as needed. MAy apply to tender joints as needed to reduce pain re: OA   MULTIPLE VITAMINS-MINERALS (MULTIVITAMIN WITH MINERALS) TABLET    Take 1 tablet by mouth daily. Take one daily as nutritional supplement.   NAPROXEN SODIUM (ALEVE) 220 MG TABLET    Take 220 mg by mouth 2 (two) times daily with a meal.   PHENYLEPHRINE-SHARK LIVER OIL-MINERAL OIL-PETROLATUM (PREPARATION  H) 0.25-3-14-71.9 % RECTAL OINTMENT    Place 1 application rectally 2 (two) times daily as needed for hemorrhoids.   POLYETHYLENE GLYCOL (MIRALAX / GLYCOLAX) PACKET    17 g in 8 ounces of fluid daily as needed for constipation.   POTASSIUM CHLORIDE SA (K-DUR,KLOR-CON) 20 MEQ TABLET    Take 1 tablet (20 mEq total) by mouth daily. Mix 20 mEq in 4 ounces of water daily   RANITIDINE (ZANTAC) 150 MG TABLET    Take one twice daily for stomach acid   RIVAROXABAN (XARELTO) 10 MG TABS TABLET    Take 20 mg by mouth daily. Take one daily for anticoagulation.   SODIUM CHLORIDE (OCEAN) 0.65 % NASAL SPRAY    Place 1 spray into the nose as needed for congestion.   SPIRONOLACTONE (ALDACTONE) 25 MG TABLET    Take 25 mg by mouth daily.   TRIAMCINOLONE CREAM (KENALOG) 0.1 %    Apply sparingly to rash daily until resolved.   TROLAMINE SALICYLATE (MYOFLEX) 10 % CREAM    Apply 4 times daily to painful finger   VITAMIN C (ASCORBIC ACID) 500 MG TABLET    Take 500 mg by mouth daily.  Modified Medications   No medications on file  Discontinued Medications   No medications on file     Physical Exam: Filed Vitals:   12/10/14 1554  BP: 108/62  Pulse: 68  Temp: 97.7 F (36.5 C)  TempSrc: Oral  Weight: 155 lb (70.308 kg)  SpO2: 98%  Physical Exam  Cardiovascular: Intact distal pulses.   irreg irreg  Pulmonary/Chest: Effort normal and breath sounds normal.  Abdominal: Soft.  Bowel sounds are normal.  Musculoskeletal:  Stooped posture/kyphosis  Neurological: He is alert.  Oriented to person and place, repeats himself frequently, but speaks eloquently  Skin: Skin is warm and dry.    Labs reviewed: Basic Metabolic Panel: 5/63/87 labs abstracted  Assessment/Plan 1. Gastroesophageal reflux disease, esophagitis presence not specified -did not tolerate being off zantac and this had to be resumed due to ongoing symptoms after family had heard about potential side effects after NPR discussion about study -after visit complete, I noted he should at least be on a vitamin D supplement due to suspected osteoporosis not helped by these meds (he's got kyphosis with significant loss of height) -there are no spinal films or bone densities in his epic chart to evaluate  2. Bilateral carpal tunnel syndrome - to wear braces at night -surgery was recommended by ortho--pt has injections and continues with the braces  3. Chronic atrial fibrillation -has had cardioversion in past, now on atenolol/chlorthalidone with controlled rate and xarelto as anticoagulant due to [place] it appears of 3 (CHF, htn, age)  70. Hypothyroidism, unspecified hypothyroidism type -in his problem list, but I don't see where he's taking any synthroid? -will need to reassess tsh before June appt  5. Mild cognitive impairment with memory loss -[place] live in [place] and has caregiver who stays with him a few days a week -MMSE 21/30 after awoken from a nap for it per the caregiver -short term memory loss is quite evident during the visit, but he is able to do some of his own ADLs including much of dressing and grooming; requires IADL help -he has dementia at this point--likely Alzheimer's disease based on very gradual decline and advanced age, lack of focal neuro deficits; has no CTs of brain to evaluate in epic  6.  Need for Prevnar:  Given today  Labs/tests ordered:  TSH before June appt Next appt:   June as already scheduled  Anysa Tacey L. Cauy Melody, D.O. [facility] [address], [place] [address] Cell Phone (Mon-Fri 8am-5pm):  [phone] On Call:  [phone] & follow prompts after 5pm & weekends Office Phone:  [phone] Office Fax:  [phone]

## 2014-12-12 ENCOUNTER — Encounter: Payer: Medicare Other | Admitting: Internal Medicine

## 2014-12-21 DIAGNOSIS — E039 Hypothyroidism, unspecified: Secondary | ICD-10-CM | POA: Insufficient documentation

## 2014-12-21 DIAGNOSIS — G3184 Mild cognitive impairment, so stated: Secondary | ICD-10-CM | POA: Insufficient documentation

## 2014-12-21 DIAGNOSIS — G5603 Carpal tunnel syndrome, bilateral upper limbs: Secondary | ICD-10-CM | POA: Insufficient documentation

## 2014-12-21 DIAGNOSIS — K219 Gastro-esophageal reflux disease without esophagitis: Secondary | ICD-10-CM | POA: Insufficient documentation

## 2014-12-25 ENCOUNTER — Telehealth: Payer: Self-pay

## 2014-12-25 NOTE — Telephone Encounter (Signed)
-----   Message from Gayland Curry, DO sent at 12/21/2014 11:41 AM EDT ----- Please call Wyatt Perkins's daughter and advise that as I was completing the documentation about our visit, I realized he really should be taking at least a vitamin D supplement (Vitamin D 2000 units daily).  I suspect he has osteoporosis based on his stooped posture/loss of height.  If they are interested, we could also order a bone density test to assess this for sure (I typically say this is a series of imaging like xrays where the patient must be flat on a table--back, hip and wrist are checked).

## 2014-12-25 NOTE — Telephone Encounter (Signed)
Spoke with daughter [name], she would like to have her father started on the Vitamin D. Explain about Bone Density, she would like to have it scheduled on either M-W-F so his Care giver can go with him. Wrote order for [facility] to schedule bone density, and start Vitamin D.

## 2014-12-26 ENCOUNTER — Other Ambulatory Visit: Payer: Self-pay | Admitting: Internal Medicine

## 2014-12-26 DIAGNOSIS — R29890 Loss of height: Secondary | ICD-10-CM

## 2015-01-03 ENCOUNTER — Ambulatory Visit
Admission: RE | Admit: 2015-01-03 | Discharge: 2015-01-03 | Disposition: A | Discharge status: Home / Self Care | Payer: Medicare Other | Source: Ambulatory Visit | Attending: Internal Medicine | Admitting: Internal Medicine

## 2015-01-03 DIAGNOSIS — R29890 Loss of height: Secondary | ICD-10-CM

## 2015-01-15 ENCOUNTER — Encounter: Payer: Self-pay | Admitting: Nurse Practitioner

## 2015-01-15 ENCOUNTER — Non-Acute Institutional Stay: Payer: Medicare Other | Admitting: Nurse Practitioner

## 2015-01-15 VITALS — BP 124/70 | HR 68 | Temp 97.4°F | Wt 153.0 lb

## 2015-01-15 DIAGNOSIS — H6122 Impacted cerumen, left ear: Secondary | ICD-10-CM | POA: Diagnosis not present

## 2015-01-15 NOTE — Progress Notes (Signed)
Patient ID: Wyatt Perkins, male   DOB: 02/23/1926, 81 y.o.   MRN: [id number]    Nursing Home Location:  [place] of Service: Clinic (12)  PCP: REED, TIFFANY, DO  Allergies  Allergen Reactions  . Simvastatin     myalgias  . Statins     All statins cause muscle cramps    Chief Complaint  Patient presents with  . Hearing Loss    left ear, irrigated twice in past week.    HPI:  [name] a 81 y.o. male seen today at [facility] clinic for evaluation of cerumen impaction and hearing loss. Staff has irrigated ear twice in the last week due to complaints of decreased hearing without clearing out wax. Staff has ordered ear drops to soften wax however murine drops have not been received from pharmacy. Staff reports pt request specialist referral due to hearing loss.    Review of Systems:  Review of Systems  Constitutional: Negative for fever, chills, activity change and appetite change.  HENT: Positive for hearing loss (left ear). Negative for ear discharge and ear pain.   Respiratory: Negative for shortness of breath.   Cardiovascular: Negative for chest pain.    Past Medical History  Diagnosis Date  . Dysrhythmia     atrial fibrillation  . Hyperlipemia   . Hypertension   . Open wound of forehead, without mention of complication 01/04/3219  . Loss of weight 10/25/2012  . Debility, unspecified 09/01/2012  . Anxiety state, unspecified 2013  . Atrial fibrillation 2013  . Shortness of breath 2013  . Other atopic dermatitis and related conditions 2013  . Cervicalgia 2013  . Nocturia 2013  . Sebaceous cyst 2012  . Other specified disease of sebaceous glands 2012  . Other bursitis disorders 2012  . Abnormality of gait 2011  . Unspecified hypothyroidism 2011  . Unspecified vitamin D deficiency 2011  . Carpal tunnel syndrome 2011  . Myalgia and myositis, unspecified 2011  . Disturbance of skin sensation 2011  . Urge  incontinence 2010  . Malignant neoplasm of prostate [id number]  . Reflux esophagitis 2007  . Lumbago 2007  . GERD (gastroesophageal reflux disease) 12/20/2012  . Rash and other nonspecific skin eruption   . Open wound of forehead, without mention of complication 37/62/8315  . Cataract   . Unspecified constipation 03/29/2013  . Height loss    Past Surgical History  Procedure Laterality Date  . Leg surgery      left  . Cardioversion N/A 10/10/2012    Procedure: CARDIOVERSION;  Surgeon: Laverda Page, MD;  Location: [facility];  Service: Cardiovascular;  Laterality: N/A;  . Appendectomy  2000  . Cataract extraction w/ intraocular lens  implant, bilateral  2007, 2009  . Eye surgery  2007-2009    extraction/IOL implants   Social History:   reports that he quit smoking about 56 years ago. He has never used smokeless tobacco. He reports that he drinks about 0.6 oz of alcohol per week. He reports that he does not use illicit drugs.  Family History  Problem Relation Age of Onset  . Alzheimer's disease Mother   . Cancer Father     lung  . Cancer Sister   . Cancer Brother   . Cancer Brother     Medications: Patient's Medications  New Prescriptions   No medications on file  Previous Medications   ACETAMINOPHEN (TYLENOL) 325 MG TABLET    Take 650 mg by mouth.  Take 2 tablets every 6 hours as needed for headache; take 2 tablets at bedtime   ANTISEPTIC ORAL RINSE (BIOTENE) LIQD    15 mLs by Mouth Rinse route as needed. Before meals   ARNICA 20 % TINC    Apply topically. Apply to tender joints or muscles up to 6 x daily as needed may self administer   ATENOLOL-CHLORTHALIDONE (TENORETIC) 50-25 MG PER TABLET    Take 1 tablet by mouth daily. Take one daily to control blood pressure.   CHOLECALCIFEROL 2000 UNITS CAPS    Take by mouth. Take one each morning Vitamin D   FUROSEMIDE (LASIX) 20 MG TABLET    20 mg. Take one tablet each morning   FUROSEMIDE (LASIX) 40 MG TABLET    Take 40 mg by  mouth. Take one if weight is >150lbs for 3 days. Then decrease back to 20mg    HOMEOPATHIC PRODUCTS (ARNICA [place]) PLLT    Take 5 each by mouth as directed. Take 5 tabs 3 times daily for 5 days then 5 pills once daily to reduce pain re: OA   HOMEOPATHIC PRODUCTS (ARNICARE) GEL    Apply 1 application topically as needed. Apply thin layer to right hip and right buttocks up to three times daily as needed. MAy apply to tender joints as needed to reduce pain re: OA   HYDROCORTISONE 2.5 % OINTMENT    Apply topically. Apply to perianal area as needed   IMIQUIMOD (ALDARA) 5 % CREAM    Apply topically. Apply to right medial leg lesion twice weekly x 12 weeks   MULTIPLE VITAMINS-MINERALS (MULTIVITAMIN WITH MINERALS) TABLET    Take 1 tablet by mouth daily. Take one daily as nutritional supplement.   NAPROXEN SODIUM (ALEVE) 220 MG TABLET    Take 220 mg by mouth 2 (two) times daily with a meal.   PHENYLEPHRINE-SHARK LIVER OIL-MINERAL OIL-PETROLATUM (PREPARATION H) 0.25-3-14-71.9 % RECTAL OINTMENT    Place 1 application rectally 2 (two) times daily as needed for hemorrhoids.   POLYETHYLENE GLYCOL (MIRALAX / GLYCOLAX) PACKET    17 g in 8 ounces of fluid daily as needed for constipation.   POTASSIUM CHLORIDE SA (K-DUR,KLOR-CON) 20 MEQ TABLET    Take 1 tablet (20 mEq total) by mouth daily. Mix 20 mEq in 4 ounces of water daily   RIVAROXABAN (XARELTO) 10 MG TABS TABLET    Take 20 mg by mouth daily. Take one daily for anticoagulation.   SODIUM CHLORIDE (OCEAN) 0.65 % NASAL SPRAY    Place 1 spray into the nose as needed for congestion.   SPIRONOLACTONE (ALDACTONE) 25 MG TABLET    Take 25 mg by mouth daily.   TROLAMINE SALICYLATE (MYOFLEX) 10 % CREAM    Apply 4 times daily to painful finger   VITAMIN C (ASCORBIC ACID) 500 MG TABLET    Take 500 mg by mouth daily.  Modified Medications   No medications on file  Discontinued Medications   BENZOCAINE 3 MG LOZG    Use as directed in the mouth or throat. Dissolve in mouth  slowly, one every 2 hours as needed, not to exceed 6 in 24 hour period   RANITIDINE (ZANTAC) 150 MG TABLET    Take one twice daily for stomach acid   TRIAMCINOLONE CREAM (KENALOG) 0.1 %    Apply sparingly to rash daily until resolved.     Physical Exam: Filed Vitals:   01/15/15 1528  BP: 124/70  Pulse: 68  Temp: 97.4 F (36.3 C)  TempSrc:  Oral  Weight: 153 lb (69.4 kg)  SpO2: 99%    Physical Exam  Constitutional: He appears well-developed and well-nourished. No distress.  HENT:  Head: Normocephalic and atraumatic.  Right Ear: Hearing and external ear normal.  Left Ear: External ear normal. Decreased hearing is noted.  Nose: Nose normal.  Mouth/Throat: Oropharynx is clear and moist. No oropharyngeal exudate.  Weber test done indicating sound to both ears Rinne test normal to right but abnormal to left  Cardiovascular: Normal rate, regular rhythm and normal heart sounds.   Pulmonary/Chest: Effort normal and breath sounds normal.  Skin: He is not diaphoretic.    Labs reviewed: Basic Metabolic Panel: No results for input(s): NA, K, CL, CO2, GLUCOSE, BUN, CREATININE, CALCIUM, MG, PHOS in the last 8760 hours. Liver Function Tests: No results for input(s): AST, ALT, ALKPHOS, BILITOT, PROT, ALBUMIN in the last 8760 hours. No results for input(s): LIPASE, AMYLASE in the last 8760 hours. No results for input(s): AMMONIA in the last 8760 hours. CBC: No results for input(s): WBC, NEUTROABS, HGB, HCT, MCV, PLT in the last 8760 hours. TSH: No results for input(s): TSH in the last 8760 hours. A1C: No results found for: HGBA1C Lipid Panel: No results for input(s): CHOL, HDL, LDLCALC, TRIG, CHOLHDL, LDLDIRECT in the last 8760 hours.    Assessment/Plan 1. Left ear impacted cerumen Hearing loss appears to be from cerumen impaction, will have staff cont on with order for murine drops twice daily 6 drops into left ear twice daily for 4 days then to irrigate  To notify if this does  not improve hearing and will send to otologist at that time.

## 2015-01-22 ENCOUNTER — Encounter: Payer: PRIVATE HEALTH INSURANCE | Admitting: Nurse Practitioner

## 2015-02-05 LAB — TSH: TSH: 2.81 u[IU]/mL (ref 0.41–5.90)

## 2015-02-11 ENCOUNTER — Encounter: Payer: Self-pay | Admitting: Internal Medicine

## 2015-02-12 ENCOUNTER — Non-Acute Institutional Stay: Payer: Medicare Other | Admitting: Internal Medicine

## 2015-02-12 ENCOUNTER — Encounter: Payer: Self-pay | Admitting: Internal Medicine

## 2015-02-12 VITALS — BP 110/62 | HR 72 | Wt 155.0 lb

## 2015-02-12 DIAGNOSIS — I482 Chronic atrial fibrillation, unspecified: Secondary | ICD-10-CM

## 2015-02-12 DIAGNOSIS — M4722 Other spondylosis with radiculopathy, cervical region: Secondary | ICD-10-CM

## 2015-02-12 DIAGNOSIS — G3184 Mild cognitive impairment, so stated: Secondary | ICD-10-CM

## 2015-02-12 DIAGNOSIS — E039 Hypothyroidism, unspecified: Secondary | ICD-10-CM

## 2015-02-12 DIAGNOSIS — R070 Pain in throat: Secondary | ICD-10-CM | POA: Diagnosis not present

## 2015-02-12 DIAGNOSIS — H6123 Impacted cerumen, bilateral: Secondary | ICD-10-CM | POA: Diagnosis not present

## 2015-02-12 DIAGNOSIS — M4712 Other spondylosis with myelopathy, cervical region: Secondary | ICD-10-CM | POA: Diagnosis not present

## 2015-02-12 NOTE — Progress Notes (Signed)
Patient ID: Wyatt Perkins, male   DOB: 12/13/1925, 81 y.o.   MRN: [id number]   Location:  [facility]  Code Status: DNR  Goals of Care:Advanced Directive information Does patient have an advance directive?: Yes, Type of Advance Directive: [place];Living will;Out of facility DNR (pink MOST or yellow form), Pre-existing out of facility DNR order (yellow form or pink MOST form): Yellow form placed in chart (order not valid for inpatient use), Does patient want to make changes to advanced directive?: No - Patient declined  Chief Complaint  Patient presents with  . Medical Management of Chronic Issues    Thyroid, A-Fib, GERD    Here with [place]  . Dysphagia    a little trouble swallowing "Adams Apple" tender    HPI: [name] a 81 y.o.  seen in the [facility] today for med mgt of chronic diseases.  He is concerned about some tenderness of his Adam's apple and soreness when he swallows.  On his adam's apple, it's been sore.  Hard to tell if inside or out.  Hurts if he moves it.  Also painful with swallowing, but less than it was 1.5 days ago.  Not hoarse.  No congestion.  No difficulty swallowing food.    Ears are doing great.  Hearing much better.  His daughter had requested he get his ears flushed every 3 mos.    Went to neurosurgery (Dr. Ellene Route) who said he had advanced spondylosis responsible for his nerve pains in his hands.    Review of Systems:  Review of Systems  Constitutional: Negative for fever, chills, weight loss and malaise/fatigue.  HENT: Positive for hearing loss and sore throat. Negative for congestion and ear pain.   Respiratory: Negative for cough, shortness of breath and stridor.   Cardiovascular: Negative for chest pain.  Gastrointestinal: Positive for heartburn. Negative for abdominal pain and constipation.  Genitourinary: Negative for dysuria, urgency and frequency.  Musculoskeletal: Positive for joint pain and neck pain.  Negative for myalgias and falls.  Skin: Negative for rash.  Neurological: Negative for dizziness and weakness.  Psychiatric/Behavioral: Positive for memory loss.    Past Medical History  Diagnosis Date  . Dysrhythmia     atrial fibrillation  . Hyperlipemia   . Hypertension   . Open wound of forehead, without mention of complication 05/08/8337  . Loss of weight 10/25/2012  . Debility, unspecified 09/01/2012  . Anxiety state, unspecified 2013  . Atrial fibrillation 2013  . Shortness of breath 2013  . Other atopic dermatitis and related conditions 2013  . Cervicalgia 2013  . Nocturia 2013  . Sebaceous cyst 2012  . Other specified disease of sebaceous glands 2012  . Other bursitis disorders 2012  . Abnormality of gait 2011  . Unspecified hypothyroidism 2011  . Unspecified vitamin D deficiency 2011  . Carpal tunnel syndrome 2011  . Myalgia and myositis, unspecified 2011  . Disturbance of skin sensation 2011  . Urge incontinence 2010  . Malignant neoplasm of prostate [id number]  . Reflux esophagitis 2007  . Lumbago 2007  . GERD (gastroesophageal reflux disease) 12/20/2012  . Rash and other nonspecific skin eruption   . Open wound of forehead, without mention of complication 73/41/9379  . Cataract   . Unspecified constipation 03/29/2013  . Height loss     Past Surgical History  Procedure Laterality Date  . Leg surgery      left  . Cardioversion N/A 10/10/2012    Procedure:  CARDIOVERSION;  Surgeon: Laverda Page, MD;  Location: [place];  Service: Cardiovascular;  Laterality: N/A;  . Appendectomy  2000  . Cataract extraction w/ intraocular lens  implant, bilateral  2007, 2009  . Eye surgery  2007-2009    extraction/IOL implants    Social History:   reports that he quit smoking about 56 years ago. He has never used smokeless tobacco. He reports that he drinks about 0.6 oz of alcohol per week. He reports that he does not use illicit drugs.  Allergies  Allergen  Reactions  . Simvastatin     myalgias  . Statins     All statins cause muscle cramps    Medications: Patient's Medications  New Prescriptions   No medications on file  Previous Medications   ACETAMINOPHEN (TYLENOL) 325 MG TABLET    Take 650 mg by mouth. Take 2 tablets every 6 hours as needed for headache; take 2 tablets at bedtime   ANTISEPTIC ORAL RINSE (BIOTENE) LIQD    15 mLs by Mouth Rinse route as needed. Before meals   ARNICA 20 % TINC    Apply topically. Apply to tender joints or muscles up to 6 x daily as needed may self administer   ATENOLOL-CHLORTHALIDONE (TENORETIC) 50-25 MG PER TABLET    Take 1 tablet by mouth daily. Take one daily to control blood pressure.   CHOLECALCIFEROL 2000 UNITS CAPS    Take by mouth. Take one each morning Vitamin D   FUROSEMIDE (LASIX) 20 MG TABLET    20 mg. Take one tablet each morning   FUROSEMIDE (LASIX) 40 MG TABLET    Take 40 mg by mouth. Take one if weight is >150lbs for 3 days. Then decrease back to 20mg    HOMEOPATHIC PRODUCTS (ARNICA [place]) PLLT    Take 5 each by mouth as directed. Take 5 tabs 3 times daily for 5 days then 5 pills once daily to reduce pain re: OA   HOMEOPATHIC PRODUCTS (ARNICARE) GEL    Apply 1 application topically as needed. Apply thin layer to right hip and right buttocks up to three times daily as needed. MAy apply to tender joints as needed to reduce pain re: OA   HYDROCORTISONE 2.5 % OINTMENT    Apply topically. Apply to perianal area as needed   IMIQUIMOD (ALDARA) 5 % CREAM    Apply topically. Apply to right medial leg lesion twice weekly x 12 weeks   MULTIPLE VITAMINS-MINERALS (MULTIVITAMIN WITH MINERALS) TABLET    Take 1 tablet by mouth daily. Take one daily as nutritional supplement.   NAPROXEN SODIUM (ALEVE) 220 MG TABLET    Take 220 mg by mouth 2 (two) times daily with a meal.   PHENYLEPHRINE-SHARK LIVER OIL-MINERAL OIL-PETROLATUM (PREPARATION H) 0.25-3-14-71.9 % RECTAL OINTMENT    Place 1 application rectally 2  (two) times daily as needed for hemorrhoids.   POLYETHYLENE GLYCOL (MIRALAX / GLYCOLAX) PACKET    17 g in 8 ounces of fluid daily as needed for constipation.   POTASSIUM CHLORIDE SA (K-DUR,KLOR-CON) 20 MEQ TABLET    Take 1 tablet (20 mEq total) by mouth daily. Mix 20 mEq in 4 ounces of water daily   RANITIDINE (ZANTAC) 150 MG TABLET    Take 150 mg by mouth 2 (two) times daily.   RIVAROXABAN (XARELTO) 10 MG TABS TABLET    Take 20 mg by mouth daily. Take one daily for anticoagulation.   SODIUM CHLORIDE (OCEAN) 0.65 % NASAL SPRAY    Place 1 spray  into the nose as needed for congestion.   SPIRONOLACTONE (ALDACTONE) 25 MG TABLET    Take 25 mg by mouth daily.   TROLAMINE SALICYLATE (MYOFLEX) 10 % CREAM    Apply 4 times daily to painful finger   VITAMIN C (ASCORBIC ACID) 500 MG TABLET    Take 500 mg by mouth daily.  Modified Medications   No medications on file  Discontinued Medications   No medications on file     Physical Exam: Filed Vitals:   02/12/15 1350  BP: 110/62  Pulse: 72  Weight: 155 lb (70.308 kg)  SpO2: 94%   Body mass index is 26.59 kg/(m^2). Physical Exam  Constitutional: He appears well-developed and well-nourished. No distress.  HENT:  Head: Normocephalic and atraumatic.  Neck:  Tenderness over adam's apple and also when he swallows tender on left side of thyroid  Cardiovascular: Intact distal pulses.   irreg irreg  Pulmonary/Chest: Effort normal and breath sounds normal. He has no wheezes. He has no rales.  Abdominal: Soft. Bowel sounds are normal.  Musculoskeletal:  Stooped posture, ambulates with rollator walker, tenderness of paravertebral muscles  Neurological: He is alert.  Very pleasant, repeats himself frequently, but always speaks eloquently  Skin: Skin is warm and dry.     Labs reviewed: Basic Metabolic Panel:  Recent Labs  02/05/15  TSH 2.81    Procedures since last appt: Neurosurgery note from Dr. Ellene Route was reviewed.  Patient Care  Team: Gayland Curry, DO as PCP - General (Geriatric Medicine) Well [place], PHD as Counselor (Psychology) Kristeen Miss, MD as Consulting Physician (Neurosurgery)  Assessment/Plan 1. Laryngeal pain -differential includes throat irritation from sinus/allergies, DeQuervain's thyroiditis, polyp or cyst or malignancy on vocal cord/laryngeal area -seemed to have mild tenderness of thyroid on left lobe and over adam's apple cartilage -advised that if this worsens, persists into next week or he develops new symptoms, we may need to further evaluate him with thyroid testing, imagine and/or ENT referral  2. Cerumen impaction, bilateral -advised to schedule q 3 monthly ear lavages with clinic nurse  3. Chronic atrial fibrillation -cont xarelto as blood thinner, rate is controlled  4. Cervical spondylosis with myelopathy and radiculopathy -is felt to be cause of his weakness and neuropathic pains in his hands; he accepts this and does not want surgical intervention at his age;  NOT carpal tunnel as I was originally told he had  65. Hypothyroidism, unspecified hypothyroidism type -TSH earlier this month was wnl at 2.81, he is NOT on medication for his thyroid at this time  6. Mild cognitive impairment with memory loss -he has caregiver assistance in his AL apt and requires prompting and frequent reminders but is physically capable of his ADLs with help; walks with walker, feeds himself; repeats himself frequently  Labs/tests ordered: no new today Next appt:  3 months  Nzinga Ferran L. Zaila Crew, D.O. [facility] [address], [place] [address] Cell Phone (Mon-Fri 8am-5pm):  [phone] On Call:  [phone] & follow prompts after 5pm & weekends Office Phone:  [phone] Office Fax:  [phone]

## 2015-04-10 ENCOUNTER — Encounter: Payer: Self-pay | Admitting: Adult Health

## 2015-04-10 ENCOUNTER — Non-Acute Institutional Stay: Payer: Medicare Other | Admitting: Adult Health

## 2015-04-10 DIAGNOSIS — G3184 Mild cognitive impairment, so stated: Secondary | ICD-10-CM | POA: Diagnosis not present

## 2015-04-10 DIAGNOSIS — I482 Chronic atrial fibrillation, unspecified: Secondary | ICD-10-CM

## 2015-04-10 DIAGNOSIS — I10 Essential (primary) hypertension: Secondary | ICD-10-CM | POA: Diagnosis not present

## 2015-04-10 DIAGNOSIS — I5032 Chronic diastolic (congestive) heart failure: Secondary | ICD-10-CM | POA: Diagnosis not present

## 2015-04-10 LAB — BASIC METABOLIC PANEL
BUN: 21 mg/dL (ref 4–21)
Creatinine: 1 mg/dL (ref 0.6–1.3)
Glucose: 101 mg/dL
Potassium: 3.7 mmol/L (ref 3.4–5.3)
Sodium: 126 mmol/L — AB (ref 137–147)

## 2015-04-10 NOTE — Progress Notes (Signed)
Patient ID: Wyatt Perkins, male   DOB: Jun 02, 1926, 81 y.o.   MRN: [id number]    Nursing Home Location:  [facility]    Patient Care Team: Gayland Curry, DO as PCP - General (Geriatric Medicine) Well [place], PHD as Counselor (Psychology) Kristeen Miss, MD as Consulting Physician (Neurosurgery)  N Place of Service: [phone])  Chief Complaint  Patient presents with  . Acute Visit    increased confusion    HPI:  81 y.o. male residing at [facility], assisted living. I was asked to see him for increased confusion. He had a urine dip taken which was negative. A BMP was drawn showing a sodium of 126.  He has no other symptoms.  He is ambulating without difficulty with a walker.  He denies any SOB, CP, n, v, d, blurred vision, or change in LOC.  He has a hx of MCI and the staff has noticed that he has become more forgetful and irritable over time.      Review of Systems:  Review of Systems  Constitutional: Negative for fever, chills, activity change, appetite change, fatigue and unexpected weight change.  HENT: Negative for congestion.   Respiratory: Negative for cough and shortness of breath.   Cardiovascular: Negative for chest pain, palpitations and leg swelling.  Gastrointestinal: Negative for abdominal pain and abdominal distention.  Genitourinary: Negative for dysuria, frequency and flank pain.  Neurological: Negative for dizziness, seizures, facial asymmetry and speech difficulty.  Psychiatric/Behavioral: Positive for confusion and dysphoric mood. Negative for behavioral problems.    Medications: Patient's Medications  New Prescriptions   No medications on file  Previous Medications   ACETAMINOPHEN (TYLENOL) 325 MG TABLET    Take 650 mg by mouth. Take 2 tablets every 6 hours as needed for headache; take 2 tablets at bedtime   ANTISEPTIC ORAL RINSE (BIOTENE) LIQD    15 mLs by Mouth Rinse route as  needed. Before meals   ARNICA 20 % TINC    Apply topically. Apply to tender joints or muscles up to 6 x daily as needed may self administer   ATENOLOL-CHLORTHALIDONE (TENORETIC) 50-25 MG PER TABLET    Take 1 tablet by mouth daily. Take one daily to control blood pressure.   CHOLECALCIFEROL 2000 UNITS CAPS    Take by mouth. Take one each morning Vitamin D   FUROSEMIDE (LASIX) 20 MG TABLET    20 mg. Take one tablet each morning   HOMEOPATHIC PRODUCTS (ARNICA [place]) PLLT    Take 5 each by mouth as directed. Take 5 tabs 3 times daily for 5 days then 5 pills once daily to reduce pain re: OA   HOMEOPATHIC PRODUCTS (ARNICARE) GEL    Apply 1 application topically as needed. Apply thin layer to right hip and right buttocks up to three times daily as needed. MAy apply to tender joints as needed to reduce pain re: OA   HYDROCORTISONE 2.5 % OINTMENT    Apply topically. Apply to perianal area as needed   IMIQUIMOD (ALDARA) 5 % CREAM    Apply topically. Apply to right medial leg lesion twice weekly x 12 weeks   MULTIPLE VITAMINS-MINERALS (MULTIVITAMIN WITH MINERALS) TABLET    Take 1 tablet by mouth daily. Take one daily as nutritional supplement.   NAPROXEN SODIUM (ALEVE) 220 MG TABLET    Take 220 mg by mouth 2 (two) times daily with a meal.   PHENYLEPHRINE-SHARK LIVER OIL-MINERAL OIL-PETROLATUM (PREPARATION H) 0.25-3-14-71.9 %  RECTAL OINTMENT    Place 1 application rectally 2 (two) times daily as needed for hemorrhoids.   POLYETHYLENE GLYCOL (MIRALAX / GLYCOLAX) PACKET    17 g in 8 ounces of fluid daily as needed for constipation.   POTASSIUM CHLORIDE SA (K-DUR,KLOR-CON) 20 MEQ TABLET    Take 1 tablet (20 mEq total) by mouth daily. Mix 20 mEq in 4 ounces of water daily   RANITIDINE (ZANTAC) 150 MG TABLET    Take 150 mg by mouth 2 (two) times daily.   RIVAROXABAN (XARELTO) 10 MG TABS TABLET    Take 20 mg by mouth daily. Take one daily for anticoagulation.   SODIUM CHLORIDE (OCEAN) 0.65 % NASAL SPRAY    Place 1  spray into the nose as needed for congestion.   TROLAMINE SALICYLATE (MYOFLEX) 10 % CREAM    Apply 4 times daily to painful finger   VITAMIN C (ASCORBIC ACID) 500 MG TABLET    Take 500 mg by mouth daily.  Modified Medications   No medications on file  Discontinued Medications   FUROSEMIDE (LASIX) 40 MG TABLET    Take 40 mg by mouth. Take one if weight is >150lbs for 3 days. Then decrease back to 20mg    SPIRONOLACTONE (ALDACTONE) 25 MG TABLET    Take 25 mg by mouth daily.     Physical Exam:  Filed Vitals:   04/10/15 1638  BP: 131/81  Pulse: 60  Temp: 98.3 F (36.8 C)  Resp: 20  SpO2: 91%    Physical Exam  Constitutional: He is oriented to person, place, and time. No distress.  HENT:  Head: Normocephalic and atraumatic.  Eyes:  Left pupil reactive to light but irregular, right PEERL  Neck: No JVD present.  Cardiovascular:  No murmur heard. Irregular, no edema  Pulmonary/Chest: Effort normal and breath sounds normal. No respiratory distress.  Abdominal: Soft. Bowel sounds are normal. He exhibits no distension.  Neurological: He is alert and oriented to person, place, and time. No cranial nerve deficit.  Able to f/c  Skin: Skin is warm and dry. He is not diaphoretic.  Psychiatric: Affect normal.    Labs reviewed/Significant Diagnostic Results:  Basic Metabolic Panel:  Recent Labs  04/10/15  NA 126*  K 3.7  BUN 21  CREATININE 1.0   Liver Function Tests: No results for input(s): AST, ALT, ALKPHOS, BILITOT, PROT, ALBUMIN in the last 8760 hours. No results for input(s): LIPASE, AMYLASE in the last 8760 hours. No results for input(s): AMMONIA in the last 8760 hours. CBC: No results for input(s): WBC, NEUTROABS, HGB, HCT, MCV, PLT in the last 8760 hours. CBG: No results for input(s): GLUCAP in the last 8760 hours. TSH:  Recent Labs  02/05/15  TSH 2.81   A1C: No results found for: HGBA1C Lipid Panel: No results for input(s): CHOL, HDL, LDLCALC, TRIG, CHOLHDL,  LDLDIRECT in the last 8760 hours.     Assessment/Plan  1. Hyponatremia -due to three diuretics on board, may be causing increased confusion -d/c aldactone and hold lasix x 48 hrs -recheck BMP   2. Chronic diastolic heart failure Stable with no increased weight, edema, or SOB -see above -monitor VS daily for 5 days and weight 3 times weekly  2. Chronic atrial fibrillation -rate controlled -on xarelto and tolerating well  3. Mild cognitive impairment with memory loss -worsening confusion with short term memory loss and irritability -see above, needs follow up with Dr Mariea Clonts for dementia if no improvement with sodium correction  4.  Essential hypertension -Controlled at this point but will need to monitor VS closely with change of meds     Cindi Carbon, [place] [phone]

## 2015-05-06 ENCOUNTER — Encounter: Payer: Self-pay | Admitting: Internal Medicine

## 2015-05-06 NOTE — Progress Notes (Deleted)
Patient ID: Wyatt Perkins, male   DOB: 1926-03-31, 81 y.o.   MRN: [id number]  Location:  *** Provider:  Rexene Edison. Mariea Clonts, D.O., C.M.D.  Code Status:  *** Goals of care: Advanced Directive information    Chief Complaint  Patient presents with  . Acute Visit    confusion second shift, sundowning which is new for him    HPI:  *** Review of Systems:  ROS  Past Medical History  Diagnosis Date  . Dysrhythmia     atrial fibrillation  . Hyperlipemia   . Hypertension   . Open wound of forehead, without mention of complication 03/03/9162  . Loss of weight 10/25/2012  . Debility, unspecified 09/01/2012  . Anxiety state, unspecified 2013  . Atrial fibrillation 2013  . Shortness of breath 2013  . Other atopic dermatitis and related conditions 2013  . Cervicalgia 2013  . Nocturia 2013  . Sebaceous cyst 2012  . Other specified disease of sebaceous glands 2012  . Other bursitis disorders 2012  . Abnormality of gait 2011  . Unspecified hypothyroidism 2011  . Unspecified vitamin D deficiency 2011  . Carpal tunnel syndrome 2011  . Myalgia and myositis, unspecified 2011  . Disturbance of skin sensation 2011  . Urge incontinence 2010  . Malignant neoplasm of prostate [id number]  . Reflux esophagitis 2007  . Lumbago 2007  . GERD (gastroesophageal reflux disease) 12/20/2012  . Rash and other nonspecific skin eruption   . Open wound of forehead, without mention of complication 57/08/7791  . Cataract   . Unspecified constipation 03/29/2013  . Height loss     Patient Active Problem List   Diagnosis Date Noted  . HTN (hypertension) 04/10/2015  . Esophageal reflux 12/21/2014  . Thyroid activity decreased 12/21/2014  . Mild cognitive impairment with memory loss 12/21/2014  . Contracture of finger joint 12/24/2013  . Flatulence 11/12/2013  . Osteoarthritis of hand 11/12/2013  . Dysphagia, pharyngoesophageal phase 05/24/2013  . Diastolic heart failure 90/30/0923  . Encounter for  long-term (current) use of other medications 05/24/2013  . Other malaise and fatigue 05/24/2013  . Nasal congestion 04/27/2013  . Unspecified constipation 03/29/2013  . Hypothyroidism 02/05/2013  . Malignant neoplasm of prostate 02/05/2013  . GERD (gastroesophageal reflux disease) 12/20/2012  . Anxiety state, unspecified 12/20/2012  . Atrial fibrillation 10/10/2012    Allergies  Allergen Reactions  . Simvastatin     myalgias  . Statins     All statins cause muscle cramps    Medications: Patient's Medications  New Prescriptions   No medications on file  Previous Medications   ACETAMINOPHEN (TYLENOL) 325 MG TABLET    Take 650 mg by mouth. Take 2 tablets every 6 hours as needed for headache; take 2 tablets at bedtime   ANTISEPTIC ORAL RINSE (BIOTENE) LIQD    15 mLs by Mouth Rinse route as needed. Before meals   ARNICA 20 % TINC    Apply topically. Apply to tender joints or muscles up to 6 x daily as needed may self administer   ATENOLOL-CHLORTHALIDONE (TENORETIC) 50-25 MG PER TABLET    Take 1 tablet by mouth daily. Take one daily to control blood pressure.   CHOLECALCIFEROL 2000 UNITS CAPS    Take by mouth. Take one each morning Vitamin D   FUROSEMIDE (LASIX) 20 MG TABLET    20 mg. Take one tablet each morning   HOMEOPATHIC PRODUCTS (ARNICA [place]) PLLT    Take 5 each by mouth as directed. Take 5 tabs 3  times daily for 5 days then 5 pills once daily to reduce pain re: OA   HOMEOPATHIC PRODUCTS (ARNICARE) GEL    Apply 1 application topically as needed. Apply thin layer to right hip and right buttocks up to three times daily as needed. MAy apply to tender joints as needed to reduce pain re: OA   HYDROCORTISONE 2.5 % OINTMENT    Apply topically. Apply to perianal area as needed   IMIQUIMOD (ALDARA) 5 % CREAM    Apply topically. Apply to right medial leg lesion twice weekly x 12 weeks   MULTIPLE VITAMINS-MINERALS (MULTIVITAMIN WITH MINERALS) TABLET    Take 1 tablet by mouth daily. Take  one daily as nutritional supplement.   NAPROXEN SODIUM (ALEVE) 220 MG TABLET    Take 220 mg by mouth 2 (two) times daily with a meal.   PHENYLEPHRINE-SHARK LIVER OIL-MINERAL OIL-PETROLATUM (PREPARATION H) 0.25-3-14-71.9 % RECTAL OINTMENT    Place 1 application rectally 2 (two) times daily as needed for hemorrhoids.   POLYETHYLENE GLYCOL (MIRALAX / GLYCOLAX) PACKET    17 g in 8 ounces of fluid daily as needed for constipation.   POTASSIUM CHLORIDE SA (K-DUR,KLOR-CON) 20 MEQ TABLET    Take 1 tablet (20 mEq total) by mouth daily. Mix 20 mEq in 4 ounces of water daily   RANITIDINE (ZANTAC) 150 MG TABLET    Take 150 mg by mouth 2 (two) times daily.   RIVAROXABAN (XARELTO) 10 MG TABS TABLET    Take 20 mg by mouth daily. Take one daily for anticoagulation.   SODIUM CHLORIDE (OCEAN) 0.65 % NASAL SPRAY    Place 1 spray into the nose as needed for congestion.   TROLAMINE SALICYLATE (MYOFLEX) 10 % CREAM    Apply 4 times daily to painful finger   VITAMIN C (ASCORBIC ACID) 500 MG TABLET    Take 500 mg by mouth daily.  Modified Medications   No medications on file  Discontinued Medications   No medications on file    Physical Exam: There were no vitals filed for this visit. There is no weight on file to calculate BMI.  Physical Exam  Labs reviewed: Basic Metabolic Panel:  Recent Labs  04/10/15  NA 126*  K 3.7  BUN 21  CREATININE 1.0    Liver Function Tests: No results for input(s): AST, ALT, ALKPHOS, BILITOT, PROT, ALBUMIN in the last 8760 hours.  CBC: No results for input(s): WBC, NEUTROABS, HGB, HCT, MCV, PLT in the last 8760 hours.  Lab Results  Component Value Date   TSH 2.81 02/05/2015   No results found for: HGBA1C No results found for: CHOL, HDL, LDLCALC, LDLDIRECT, TRIG, CHOLHDL  Significant Diagnostic Results since last visit: ***  Patient Care Team: Gayland Curry, DO as PCP - General (Geriatric Medicine) Well [place], PHD as  Counselor (Psychology) Kristeen Miss, MD as Consulting Physician (Neurosurgery)  Assessment/Plan There are no diagnoses linked to this encounter.   Family/ staff Communication: ***  Labs/tests ordered:  ***  Anna-Marie Coller L. Demarion Pondexter, D.O. [facility] [address], [place] [address] Cell Phone (Mon-Fri 8am-5pm):  [phone] On Call:  [phone] & follow prompts after 5pm & weekends Office Phone:  [phone] Office Fax:  [phone]    This encounter was created in error - please disregard.

## 2015-05-07 NOTE — Progress Notes (Signed)
error 

## 2015-05-13 ENCOUNTER — Non-Acute Institutional Stay: Payer: Medicare Other | Admitting: Internal Medicine

## 2015-05-13 DIAGNOSIS — I5032 Chronic diastolic (congestive) heart failure: Secondary | ICD-10-CM | POA: Diagnosis not present

## 2015-05-13 DIAGNOSIS — E871 Hypo-osmolality and hyponatremia: Secondary | ICD-10-CM | POA: Diagnosis not present

## 2015-05-13 DIAGNOSIS — G3184 Mild cognitive impairment, so stated: Secondary | ICD-10-CM

## 2015-05-13 NOTE — Progress Notes (Signed)
Patient ID: Wyatt Perkins, male   DOB: Nov 20, 1925, 81 y.o.   MRN: [id number]  Location:  [facility] Provider:  Jonelle Sidle L. Mariea Clonts, D.O., C.M.D.  Code Status:  DNR Goals of care: Advanced Directive information Does patient have an advance directive?: Yes, Type of Advance Directive: [place];Living will;Out of facility DNR (pink MOST or yellow form), Pre-existing out of facility DNR order (yellow form or pink MOST form): Yellow form placed in chart (order not valid for inpatient use), Does patient want to make changes to advanced directive?: No - Patient declined  Chief Complaint  Patient presents with  . Acute Visit    mental status changes in the afternoons, family concerned about his memory loss progressing    HPI:  81 yo white male resident of [facility] was seen re: his mental status fluctuations in the afternoons.  He has early dementia.  He's no longer able to keep track of appts and medications on his own.  Apparently, he was seen in August due to these concerns and was found to be hyponatremic at the time which could cause delirium.  He did have this repeated and it improved when aldactone was stopped and lasix held for 2 days.  Of note, pt's daughter was present and she and her father had decided to switch to Dr. Felipa Eth due to problems they had with pt's wife when in memory care that involved the psychiatrist here.  I tried to explain that he would not necessarily be involved now and that we could manage him here.  Also we are here in the building 4.5days per week, but that is not enough at this point.     Review of Systems:  Review of Systems  Constitutional: Negative for fever and chills.  HENT: Negative for hearing loss.   Eyes: Negative for blurred vision.  Respiratory: Negative for shortness of breath.   Cardiovascular: Negative for chest pain and leg swelling.  Gastrointestinal: Negative for abdominal pain.  Genitourinary: Negative for dysuria.    Musculoskeletal: Negative for falls.  Neurological: Positive for tingling and sensory change. Negative for dizziness.       In hands, arthritis known in neck; has been said to have both neck arthritis and carpal tunnel   Psychiatric/Behavioral: Positive for memory loss.    Past Medical History  Diagnosis Date  . Dysrhythmia     atrial fibrillation  . Hyperlipemia   . Hypertension   . Open wound of forehead, without mention of complication 10/30/6710  . Loss of weight 10/25/2012  . Debility, unspecified 09/01/2012  . Anxiety state, unspecified 2013  . Atrial fibrillation ([place]) 2013  . Shortness of breath 2013  . Other atopic dermatitis and related conditions 2013  . Cervicalgia 2013  . Nocturia 2013  . Sebaceous cyst 2012  . Other specified disease of sebaceous glands 2012  . Other bursitis disorders 2012  . Abnormality of gait 2011  . Unspecified hypothyroidism 2011  . Unspecified vitamin D deficiency 2011  . Carpal tunnel syndrome 2011  . Myalgia and myositis, unspecified 2011  . Disturbance of skin sensation 2011  . Urge incontinence 2010  . Malignant neoplasm of prostate ([place]) [id number]  . Reflux esophagitis 2007  . Lumbago 2007  . GERD (gastroesophageal reflux disease) 12/20/2012  . Rash and other nonspecific skin eruption   . Open wound of forehead, without mention of complication 38/25/0539  . Cataract   . Unspecified constipation 03/29/2013  . Height loss  Patient Active Problem List   Diagnosis Date Noted  . HTN (hypertension) 04/10/2015  . Esophageal reflux 12/21/2014  . Thyroid activity decreased 12/21/2014  . Mild cognitive impairment with memory loss 12/21/2014  . Contracture of finger joint 12/24/2013  . Flatulence 11/12/2013  . Osteoarthritis of hand 11/12/2013  . Dysphagia, pharyngoesophageal phase 05/24/2013  . Diastolic heart failure 86/75/4492  . Encounter for long-term (current) use of other medications 05/24/2013  . Other malaise and fatigue  05/24/2013  . Nasal congestion 04/27/2013  . Unspecified constipation 03/29/2013  . Hypothyroidism 02/05/2013  . Malignant neoplasm of prostate 02/05/2013  . GERD (gastroesophageal reflux disease) 12/20/2012  . Anxiety state, unspecified 12/20/2012  . Atrial fibrillation 10/10/2012    Allergies  Allergen Reactions  . Simvastatin     myalgias  . Statins     All statins cause muscle cramps    Medications: Patient's Medications  New Prescriptions   No medications on file  Previous Medications   ACETAMINOPHEN (TYLENOL) 325 MG TABLET    Take 650 mg by mouth. Take 2 tablets every 6 hours as needed for headache; take 2 tablets at bedtime   ANTISEPTIC ORAL RINSE (BIOTENE) LIQD    15 mLs by Mouth Rinse route as needed. Before meals   ARNICA 20 % TINC    Apply topically. Apply to tender joints or muscles up to 6 x daily as needed may self administer   ATENOLOL-CHLORTHALIDONE (TENORETIC) 50-25 MG PER TABLET    Take 1 tablet by mouth daily. Take one daily to control blood pressure.   CHOLECALCIFEROL 2000 UNITS CAPS    Take by mouth. Take one each morning Vitamin D   FUROSEMIDE (LASIX) 20 MG TABLET    20 mg. Take one tablet each morning   HOMEOPATHIC PRODUCTS (ARNICA [place]) PLLT    Take 5 each by mouth as directed. Take 5 tabs 3 times daily for 5 days then 5 pills once daily to reduce pain re: OA   HOMEOPATHIC PRODUCTS (ARNICARE) GEL    Apply 1 application topically as needed. Apply thin layer to right hip and right buttocks up to three times daily as needed. MAy apply to tender joints as needed to reduce pain re: OA   HYDROCORTISONE 2.5 % OINTMENT    Apply topically. Apply to perianal area as needed   IMIQUIMOD (ALDARA) 5 % CREAM    Apply topically. Apply to right medial leg lesion twice weekly x 12 weeks   MULTIPLE VITAMINS-MINERALS (MULTIVITAMIN WITH MINERALS) TABLET    Take 1 tablet by mouth daily. Take one daily as nutritional supplement.   NAPROXEN SODIUM (ALEVE) 220 MG TABLET    Take  220 mg by mouth 2 (two) times daily with a meal.   PHENYLEPHRINE-SHARK LIVER OIL-MINERAL OIL-PETROLATUM (PREPARATION H) 0.25-3-14-71.9 % RECTAL OINTMENT    Place 1 application rectally 2 (two) times daily as needed for hemorrhoids.   POLYETHYLENE GLYCOL (MIRALAX / GLYCOLAX) PACKET    17 g in 8 ounces of fluid daily as needed for constipation.   POTASSIUM CHLORIDE SA (K-DUR,KLOR-CON) 20 MEQ TABLET    Take 1 tablet (20 mEq total) by mouth daily. Mix 20 mEq in 4 ounces of water daily   RANITIDINE (ZANTAC) 150 MG TABLET    Take 150 mg by mouth 2 (two) times daily.   RIVAROXABAN (XARELTO) 10 MG TABS TABLET    Take 20 mg by mouth daily. Take one daily for anticoagulation.   SODIUM CHLORIDE (OCEAN) 0.65 % NASAL SPRAY  Place 1 spray into the nose as needed for congestion.   TROLAMINE SALICYLATE (MYOFLEX) 10 % CREAM    Apply 4 times daily to painful finger   VITAMIN C (ASCORBIC ACID) 500 MG TABLET    Take 500 mg by mouth daily.  Modified Medications   No medications on file  Discontinued Medications   No medications on file    Physical Exam: Filed Vitals:   05/13/15 1226  BP: 135/77  Pulse: 98  Temp: 97.6 F (36.4 C)  Resp: 16  Weight: 152 lb 6.4 oz (69.128 kg)  SpO2: 99%   Body mass index is 26.15 kg/(m^2).  Physical Exam  Constitutional: He appears well-developed and well-nourished. No distress.  HENT:  Head: Normocephalic and atraumatic.  Musculoskeletal: Normal range of motion.  kyphotic  Neurological: He is alert.  Short term memory loss evident--repeats himself numerous times during visit  Skin: Skin is warm and dry.  Psychiatric: He has a normal mood and affect.    Labs reviewed: Basic Metabolic Panel:  Recent Labs  04/10/15  NA 126*  K 3.7  BUN 21  CREATININE 1.0    Liver Function Tests: No results for input(s): AST, ALT, ALKPHOS, BILITOT, PROT, ALBUMIN in the last 8760 hours.  CBC: No results for input(s): WBC, NEUTROABS, HGB, HCT, MCV, PLT in the last 8760  hours.  Lab Results  Component Value Date   TSH 2.81 02/05/2015   No results found for: HGBA1C No results found for: CHOL, HDL, LDLCALC, LDLDIRECT, TRIG, CHOLHDL   Patient Care Team: Lajean Manes, MD as PCP - General (Internal Medicine) [facility] [place], PHD as Counselor (Psychology) Kristeen Miss, MD as Consulting Physician (Neurosurgery) Changing PCP as of today  Assessment/Plan 1. Mild cognitive impairment with memory loss -he is gradually progressing to early dementia -he has some increased confusion in the afternoons which is not unusual in this group -I made no changes today -suspect some of this change was in the context of the hyponatremia which improved with discontinued aldactone  2. Hyponatremia -improved BMP so continues on two diuretics with lasix and hctz which I would not do long term  3. Chronic diastolic heart failure (HCC) -on two diuretics, is on potassium; would favor d/c hctz also -also not a good idea for him to be on all the diuretics plus aleve for pain due to renal complications that could develop  Family/ staff Communication: met for more than 30 mins with patient and his daughter discussing his care at [facility]--he is still going to switch providers due to his wife's prior bad experience before I began working here  Labs/tests ordered:  No new  Mattia Osterman L. Kim Oki, D.O. [facility] [address], [place] [address] Cell Phone (Mon-Fri 8am-5pm):  [phone] On Call:  [phone] & follow prompts after 5pm & weekends Office Phone:  [phone] Office Fax:  [phone]

## 2015-05-14 ENCOUNTER — Encounter: Payer: Medicare Other | Admitting: Internal Medicine

## 2015-05-31 ENCOUNTER — Encounter: Payer: Self-pay | Admitting: Internal Medicine

## 2015-06-04 ENCOUNTER — Ambulatory Visit
Admission: RE | Admit: 2015-06-04 | Discharge: 2015-06-04 | Disposition: A | Discharge status: Home / Self Care | Payer: Medicare Other | Source: Ambulatory Visit | Attending: Cardiology | Admitting: Cardiology

## 2015-06-04 ENCOUNTER — Other Ambulatory Visit: Payer: Self-pay | Admitting: Cardiology

## 2015-06-04 DIAGNOSIS — R0602 Shortness of breath: Secondary | ICD-10-CM

## 2015-06-04 DIAGNOSIS — J8410 Pulmonary fibrosis, unspecified: Secondary | ICD-10-CM

## 2015-08-20 ENCOUNTER — Encounter: Payer: Medicare Other | Admitting: Internal Medicine

## 2016-05-18 IMAGING — CR DG CHEST 2V
2 series · 2 of 2 positions shown · non-contrast
Comparison: 07/01/2014

CLINICAL DATA: Shortness of breath. Pulmonary fibrosis. Increasing
shortness of Breath

EXAM:
CHEST  2 VIEW

[w chest lat]
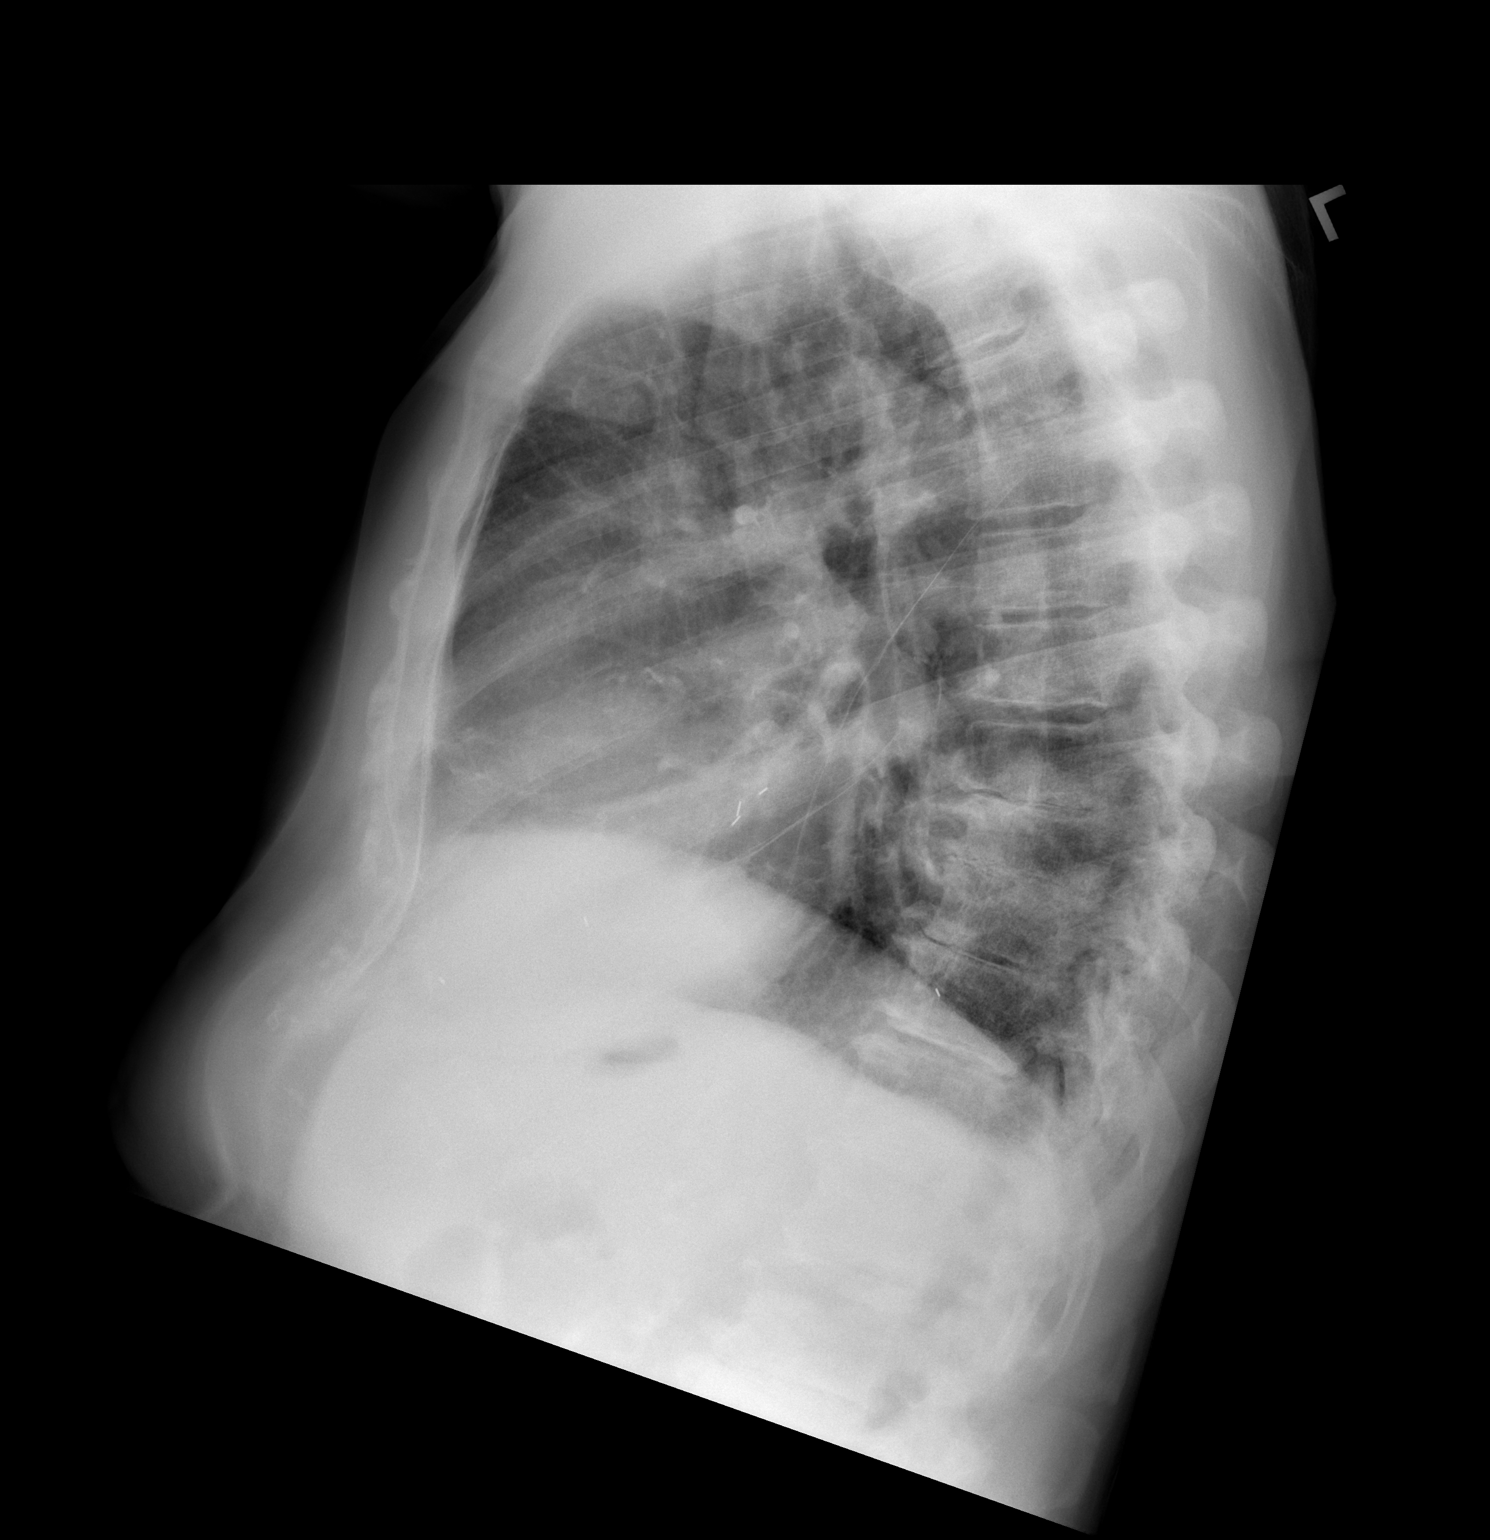

[w chest ap]
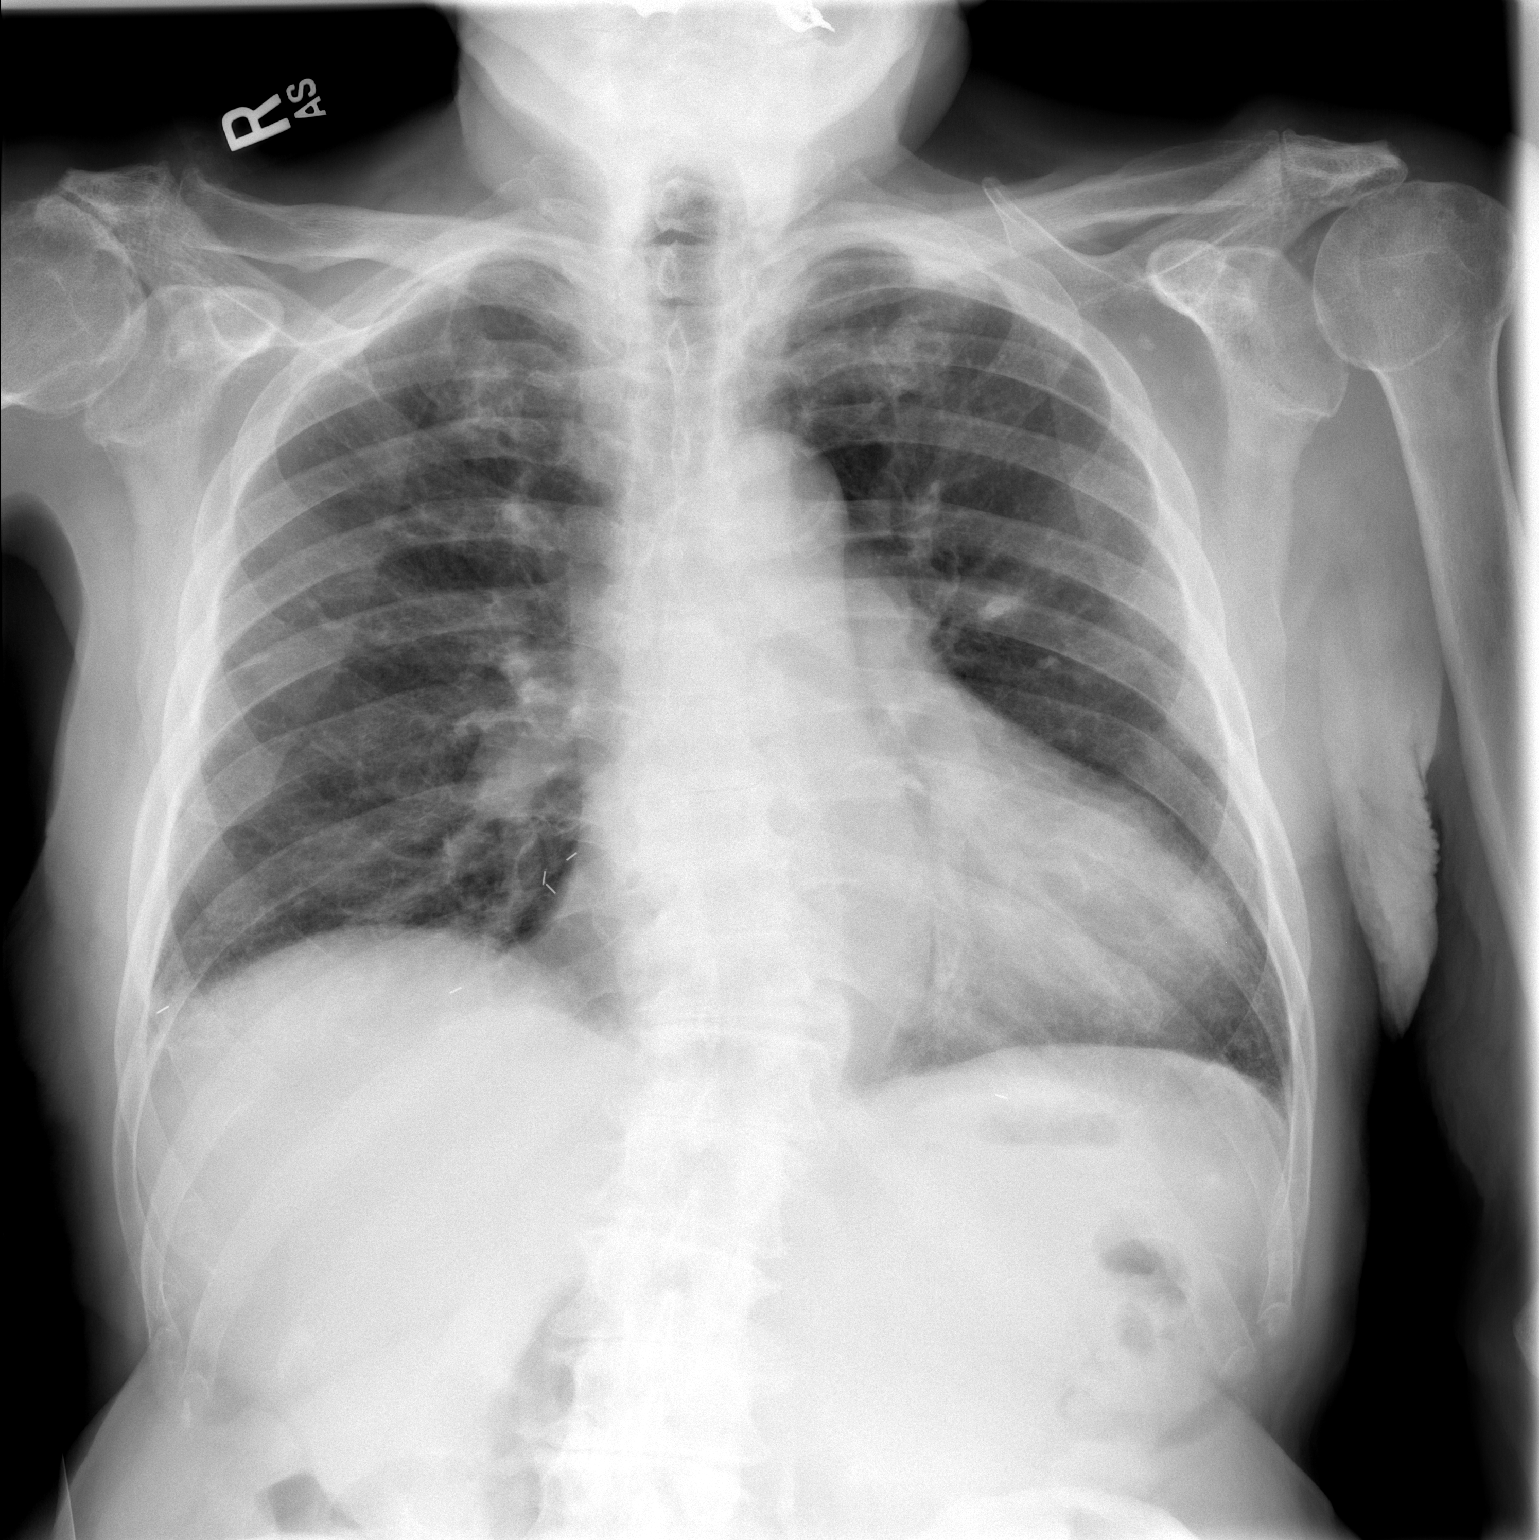

[2 of 2 positions shown; findings below may reference images not displayed]

FINDINGS: Cardiomegaly with mild venous congestion. No confluent opacities,
edema or effusions. No acute bony abnormality.
IMPRESSION: Cardiomegaly, vascular congestion.

## 2016-06-25 ENCOUNTER — Inpatient Hospital Stay (HOSPITAL_COMMUNITY)
Admission: EM | Admit: 2016-06-25 | Discharge: 2016-06-30 | DRG: 377 | Disposition: A | Discharge status: Skilled Nursing Facility | Payer: Medicare Other | Attending: Internal Medicine | Admitting: Internal Medicine

## 2016-06-25 ENCOUNTER — Observation Stay (HOSPITAL_COMMUNITY): Payer: Medicare Other

## 2016-06-25 ENCOUNTER — Encounter (HOSPITAL_COMMUNITY): Payer: Self-pay | Admitting: Emergency Medicine

## 2016-06-25 DIAGNOSIS — Z961 Presence of intraocular lens: Secondary | ICD-10-CM | POA: Diagnosis present

## 2016-06-25 DIAGNOSIS — I110 Hypertensive heart disease with heart failure: Secondary | ICD-10-CM | POA: Diagnosis present

## 2016-06-25 DIAGNOSIS — G3184 Mild cognitive impairment, so stated: Secondary | ICD-10-CM | POA: Diagnosis present

## 2016-06-25 DIAGNOSIS — J189 Pneumonia, unspecified organism: Secondary | ICD-10-CM

## 2016-06-25 DIAGNOSIS — E785 Hyperlipidemia, unspecified: Secondary | ICD-10-CM | POA: Diagnosis present

## 2016-06-25 DIAGNOSIS — I10 Essential (primary) hypertension: Secondary | ICD-10-CM | POA: Diagnosis present

## 2016-06-25 DIAGNOSIS — F0281 Dementia in other diseases classified elsewhere with behavioral disturbance: Secondary | ICD-10-CM | POA: Diagnosis present

## 2016-06-25 DIAGNOSIS — Z820 Family history of epilepsy and other diseases of the nervous system: Secondary | ICD-10-CM

## 2016-06-25 DIAGNOSIS — Z9981 Dependence on supplemental oxygen: Secondary | ICD-10-CM

## 2016-06-25 DIAGNOSIS — Z8701 Personal history of pneumonia (recurrent): Secondary | ICD-10-CM

## 2016-06-25 DIAGNOSIS — K5791 Diverticulosis of intestine, part unspecified, without perforation or abscess with bleeding: Secondary | ICD-10-CM | POA: Diagnosis not present

## 2016-06-25 DIAGNOSIS — K625 Hemorrhage of anus and rectum: Secondary | ICD-10-CM | POA: Diagnosis not present

## 2016-06-25 DIAGNOSIS — I5032 Chronic diastolic (congestive) heart failure: Secondary | ICD-10-CM | POA: Diagnosis not present

## 2016-06-25 DIAGNOSIS — Z87891 Personal history of nicotine dependence: Secondary | ICD-10-CM

## 2016-06-25 DIAGNOSIS — G3183 Dementia with Lewy bodies: Secondary | ICD-10-CM

## 2016-06-25 DIAGNOSIS — R319 Hematuria, unspecified: Secondary | ICD-10-CM | POA: Diagnosis present

## 2016-06-25 DIAGNOSIS — Z8546 Personal history of malignant neoplasm of prostate: Secondary | ICD-10-CM

## 2016-06-25 DIAGNOSIS — K922 Gastrointestinal hemorrhage, unspecified: Secondary | ICD-10-CM | POA: Diagnosis not present

## 2016-06-25 DIAGNOSIS — R1314 Dysphagia, pharyngoesophageal phase: Secondary | ICD-10-CM | POA: Diagnosis present

## 2016-06-25 DIAGNOSIS — Z66 Do not resuscitate: Secondary | ICD-10-CM | POA: Diagnosis present

## 2016-06-25 DIAGNOSIS — I482 Chronic atrial fibrillation: Secondary | ICD-10-CM | POA: Diagnosis not present

## 2016-06-25 DIAGNOSIS — E43 Unspecified severe protein-calorie malnutrition: Secondary | ICD-10-CM | POA: Insufficient documentation

## 2016-06-25 DIAGNOSIS — I480 Paroxysmal atrial fibrillation: Secondary | ICD-10-CM | POA: Diagnosis present

## 2016-06-25 DIAGNOSIS — K219 Gastro-esophageal reflux disease without esophagitis: Secondary | ICD-10-CM | POA: Diagnosis present

## 2016-06-25 DIAGNOSIS — C61 Malignant neoplasm of prostate: Secondary | ICD-10-CM | POA: Diagnosis present

## 2016-06-25 DIAGNOSIS — K5641 Fecal impaction: Secondary | ICD-10-CM | POA: Diagnosis present

## 2016-06-25 DIAGNOSIS — R339 Retention of urine, unspecified: Secondary | ICD-10-CM | POA: Diagnosis present

## 2016-06-25 DIAGNOSIS — Z79899 Other long term (current) drug therapy: Secondary | ICD-10-CM

## 2016-06-25 DIAGNOSIS — Z7901 Long term (current) use of anticoagulants: Secondary | ICD-10-CM

## 2016-06-25 DIAGNOSIS — I5030 Unspecified diastolic (congestive) heart failure: Secondary | ICD-10-CM | POA: Diagnosis present

## 2016-06-25 DIAGNOSIS — I4891 Unspecified atrial fibrillation: Secondary | ICD-10-CM | POA: Diagnosis present

## 2016-06-25 DIAGNOSIS — E039 Hypothyroidism, unspecified: Secondary | ICD-10-CM | POA: Diagnosis present

## 2016-06-25 DIAGNOSIS — Z888 Allergy status to other drugs, medicaments and biological substances status: Secondary | ICD-10-CM

## 2016-06-25 LAB — CBC
HEMATOCRIT: 45.6 % (ref 39.0–52.0)
Hemoglobin: 14.6 g/dL (ref 13.0–17.0)
MCH: 31.4 pg (ref 26.0–34.0)
MCHC: 32 g/dL (ref 30.0–36.0)
MCV: 98.1 fL (ref 78.0–100.0)
Platelets: 288 10*3/uL (ref 150–400)
RBC: 4.65 MIL/uL (ref 4.22–5.81)
RDW: 13.6 % (ref 11.5–15.5)
WBC: 9.8 10*3/uL (ref 4.0–10.5)

## 2016-06-25 LAB — COMPREHENSIVE METABOLIC PANEL
ALBUMIN: 4.4 g/dL (ref 3.5–5.0)
ALT: 29 U/L (ref 17–63)
AST: 39 U/L (ref 15–41)
Alkaline Phosphatase: 91 U/L (ref 38–126)
Anion gap: 7 (ref 5–15)
BUN: 21 mg/dL — AB (ref 6–20)
CHLORIDE: 98 mmol/L — AB (ref 101–111)
CO2: 33 mmol/L — AB (ref 22–32)
Calcium: 9.5 mg/dL (ref 8.9–10.3)
Creatinine, Ser: 0.96 mg/dL (ref 0.61–1.24)
GFR calc Af Amer: >60 mL/min (ref >60)
GFR calc non Af Amer: >60 mL/min (ref >60)
GLUCOSE: 91 mg/dL (ref 65–99)
POTASSIUM: 4.1 mmol/L (ref 3.5–5.1)
Sodium: 138 mmol/L (ref 135–145)
Total Bilirubin: 1.4 mg/dL — ABNORMAL HIGH (ref 0.3–1.2)
Total Protein: 7.3 g/dL (ref 6.5–8.1)

## 2016-06-25 LAB — ABO/RH: ABO/RH(D): A POS

## 2016-06-25 LAB — HEMOGLOBIN AND HEMATOCRIT, BLOOD
HCT: 43.1 % (ref 39.0–52.0)
HEMATOCRIT: 41.3 % (ref 39.0–52.0)
HEMOGLOBIN: 13.4 g/dL (ref 13.0–17.0)
Hemoglobin: 14 g/dL (ref 13.0–17.0)

## 2016-06-25 LAB — TYPE AND SCREEN
ABO/RH(D): A POS
Antibody Screen: NEGATIVE

## 2016-06-25 LAB — POC OCCULT BLOOD, ED: Fecal Occult Bld: POSITIVE — AB

## 2016-06-25 MED ORDER — LORAZEPAM 0.5 MG PO TABS
0.2500 mg | ORAL_TABLET | Freq: Every day | ORAL | Status: DC | PRN
  Administered 2016-06-25 – 2016-06-29 (×3): 0.25 mg via ORAL
  Filled 2016-06-25 (×4): qty 1

## 2016-06-25 MED ORDER — FAMOTIDINE 20 MG PO TABS
20.0000 mg | ORAL_TABLET | Freq: Every day | ORAL | Status: DC
  Administered 2016-06-26 – 2016-06-27 (×2): 20 mg via ORAL
  Filled 2016-06-25 (×2): qty 1

## 2016-06-25 MED ORDER — TRAZODONE HCL 50 MG PO TABS
25.0000 mg | ORAL_TABLET | Freq: Every day | ORAL | Status: DC
  Administered 2016-06-25 – 2016-06-28 (×4): 25 mg via ORAL
  Filled 2016-06-25 (×4): qty 1

## 2016-06-25 MED ORDER — MELATONIN 5 MG PO TABS: 5.0000 mg | ORAL_TABLET | Freq: Every day | ORAL | Status: DC

## 2016-06-25 MED ORDER — ISOSORBIDE MONONITRATE ER 60 MG PO TB24
60.0000 mg | ORAL_TABLET | Freq: Every day | ORAL | Status: DC
  Administered 2016-06-26 – 2016-06-27 (×2): 60 mg via ORAL
  Filled 2016-06-25 (×4): qty 1

## 2016-06-25 MED ORDER — VITAMIN D 1000 UNITS PO TABS
2000.0000 [IU] | ORAL_TABLET | Freq: Every day | ORAL | Status: DC
  Administered 2016-06-26 – 2016-06-27 (×2): 2000 [IU] via ORAL
  Filled 2016-06-25 (×7): qty 2

## 2016-06-25 MED ORDER — ACETAMINOPHEN 325 MG PO TABS
650.0000 mg | ORAL_TABLET | Freq: Four times a day (QID) | ORAL | Status: DC | PRN
  Administered 2016-06-28 – 2016-06-29 (×3): 650 mg via ORAL
  Filled 2016-06-25 (×3): qty 2

## 2016-06-25 MED ORDER — ONDANSETRON HCL 4 MG PO TABS: 4.0000 mg | ORAL_TABLET | Freq: Four times a day (QID) | ORAL | Status: DC | PRN

## 2016-06-25 MED ORDER — BOOST / RESOURCE BREEZE PO LIQD
1.0000 | Freq: Three times a day (TID) | ORAL | Status: DC
  Administered 2016-06-25 – 2016-06-27 (×5): 1 via ORAL

## 2016-06-25 MED ORDER — POLYETHYLENE GLYCOL 3350 17 G PO PACK: 17.0000 g | PACK | Freq: Every day | ORAL | Status: DC | PRN

## 2016-06-25 MED ORDER — ONDANSETRON HCL 4 MG/2ML IJ SOLN: 4.0000 mg | Freq: Four times a day (QID) | INTRAMUSCULAR | Status: DC | PRN

## 2016-06-25 MED ORDER — CALCIUM CARBONATE ANTACID 500 MG PO CHEW: 1.0000 | CHEWABLE_TABLET | Freq: Three times a day (TID) | ORAL | Status: DC | PRN

## 2016-06-25 MED ORDER — ACETAMINOPHEN 650 MG RE SUPP: 650.0000 mg | Freq: Four times a day (QID) | RECTAL | Status: DC | PRN

## 2016-06-25 MED ORDER — PAROXETINE HCL 10 MG PO TABS
10.0000 mg | ORAL_TABLET | Freq: Every day | ORAL | Status: DC
  Administered 2016-06-26 – 2016-06-27 (×2): 10 mg via ORAL
  Filled 2016-06-25 (×6): qty 1

## 2016-06-25 MED ORDER — ATENOLOL 25 MG PO TABS
50.0000 mg | ORAL_TABLET | Freq: Every day | ORAL | Status: DC
  Administered 2016-06-26 – 2016-06-27 (×2): 50 mg via ORAL
  Filled 2016-06-25 (×3): qty 2

## 2016-06-25 MED ORDER — TAMSULOSIN HCL 0.4 MG PO CAPS
0.4000 mg | ORAL_CAPSULE | Freq: Every day | ORAL | Status: DC
  Administered 2016-06-25 – 2016-06-27 (×3): 0.4 mg via ORAL
  Filled 2016-06-25 (×3): qty 1

## 2016-06-25 MED ORDER — POTASSIUM CHLORIDE CRYS ER 20 MEQ PO TBCR
20.0000 meq | EXTENDED_RELEASE_TABLET | Freq: Every day | ORAL | Status: DC
  Administered 2016-06-26 – 2016-06-27 (×2): 20 meq via ORAL
  Filled 2016-06-25 (×4): qty 1

## 2016-06-25 MED ORDER — SODIUM CHLORIDE 0.9 % IV SOLN
INTRAVENOUS | Status: DC
  Administered 2016-06-25: 18:00:00 via INTRAVENOUS

## 2016-06-25 MED ORDER — HALOPERIDOL LACTATE 5 MG/ML IJ SOLN
2.0000 mg | Freq: Once | INTRAMUSCULAR | Status: AC
  Administered 2016-06-25: 2 mg via INTRAVENOUS
  Filled 2016-06-25: qty 1

## 2016-06-25 NOTE — ED Provider Notes (Signed)
[facility] DEPT Provider Note   CSN: [id number] Arrival date & time: 06/25/16  1151     History   Chief Complaint Chief Complaint  Patient presents with  . Rectal Bleeding    HPI Wyatt Perkins is a 82 y.o. male.  Wyatt Perkins  is a 82 y.o. Male past medical history of atrial fibrillation on Xarelto, dementia, hyperlipidemia, hypertension, prostate cancer, who presents to the ED from nursing facility with CNA for rectal bleeding with large clots since last night. Per the CNA patient has had 2-3 episodes since last evening. He also complained of abd pain that is intermittent. Nothing makes better or worse. Tried nothing for the pain. He also reports feeling lightheaded. He denies any fever, chills, ha, vision changes, cp, palpitation, sob, urinary symptoms, diarrhea, constipation, melena, history of same.   History was obtained from patient and CNA.      The history is provided by the patient, a caregiver and medical records.    Past Medical History:  Diagnosis Date  . Abnormality of gait 2011  . Anxiety state, unspecified 2013  . Atrial fibrillation ([place]) 2013  . Carpal tunnel syndrome 2011  . Cataract   . Cervicalgia 2013  . Debility, unspecified 09/01/2012  . Disturbance of skin sensation 2011  . Dysrhythmia    atrial fibrillation  . GERD (gastroesophageal reflux disease) 12/20/2012  . Height loss   . Hyperlipemia   . Hypertension   . Loss of weight 10/25/2012  . Lumbago 2007  . Malignant neoplasm of prostate ([place]) [id number]  . Myalgia and myositis, unspecified 2011  . Nocturia 2013  . Open wound of forehead, without mention of complication [id number]  . Open wound of forehead, without mention of complication [id number]  . Other atopic dermatitis and related conditions 2013  . Other bursitis disorders 2012  . Other specified disease of sebaceous glands 2012  . Rash and other nonspecific skin eruption   . Reflux esophagitis 2007  . Sebaceous cyst 2012  .  Shortness of breath 2013  . Unspecified constipation 03/29/2013  . Unspecified hypothyroidism 2011  . Unspecified vitamin D deficiency 2011  . Urge incontinence 2010    Patient Active Problem List   Diagnosis Date Noted  . HTN (hypertension) 04/10/2015  . Esophageal reflux 12/21/2014  . Thyroid activity decreased 12/21/2014  . Mild cognitive impairment with memory loss 12/21/2014  . Contracture of finger joint 12/24/2013  . Flatulence 11/12/2013  . Osteoarthritis of hand 11/12/2013  . Dysphagia, pharyngoesophageal phase 05/24/2013  . Diastolic heart failure ([place]) 05/24/2013  . Encounter for long-term (current) use of other medications 05/24/2013  . Other malaise and fatigue 05/24/2013  . Nasal congestion 04/27/2013  . Unspecified constipation 03/29/2013  . Hypothyroidism 02/05/2013  . Malignant neoplasm of prostate ([place]) 02/05/2013  . GERD (gastroesophageal reflux disease) 12/20/2012  . Anxiety state, unspecified 12/20/2012  . Atrial fibrillation ([place]) 10/10/2012    Past Surgical History:  Procedure Laterality Date  . APPENDECTOMY  2000  . CARDIOVERSION N/A 10/10/2012   Procedure: CARDIOVERSION;  Surgeon: Laverda Page, MD;  Location: [facility];  Service: Cardiovascular;  Laterality: N/A;  . CATARACT EXTRACTION W/ INTRAOCULAR LENS  IMPLANT, BILATERAL  2007, 2009  . EYE SURGERY  2007-2009   extraction/IOL implants  . LEG SURGERY     left       Home Medications    Prior to Admission medications   Medication Sig Start Date End Date Taking? Authorizing Provider  acetaminophen (TYLENOL) 325 MG  tablet Take 650 mg by mouth at bedtime. And may Take 650mg  by mouth every 6 hours as needed for headache/pain   Yes Historical Provider, MD  antiseptic oral rinse (BIOTENE) LIQD 15 mLs by Mouth Rinse route 3 (three) times daily before meals. Before meals    Yes Historical Provider, MD  atenolol (TENORMIN) 50 MG tablet Take 50 mg by mouth daily.   Yes Historical Provider, MD    calcium carbonate (TUMS - DOSED IN MG ELEMENTAL CALCIUM) 500 MG chewable tablet Chew 1 tablet by mouth 3 (three) times daily as needed for indigestion or heartburn.   Yes Historical Provider, MD  Cholecalciferol 2000 UNITS CAPS Take 2,000 Units by mouth daily. Take one each morning Vitamin D    Yes Historical Provider, MD  furosemide (LASIX) 20 MG tablet Take 20 mg by mouth daily.  05/07/13  Yes Historical Provider, MD  isosorbide mononitrate (IMDUR) 60 MG 24 hr tablet Take 60 mg by mouth daily.   Yes Historical Provider, MD  loperamide (IMODIUM) 2 MG capsule Take 2 mg by mouth 2 (two) times daily as needed for diarrhea or loose stools.   Yes Historical Provider, MD  LORazepam (ATIVAN) 0.5 MG tablet Take 0.25 mg by mouth daily as needed for anxiety.   Yes Historical Provider, MD  Melatonin 5 MG TABS Take 5 mg by mouth at bedtime.   Yes Historical Provider, MD  Multiple Vitamins-Minerals (MULTIVITAMIN WITH MINERALS) tablet Take 1 tablet by mouth daily. Take one daily as nutritional supplement.   Yes Historical Provider, MD  PARoxetine (PAXIL) 10 MG tablet Take 10 mg by mouth daily.   Yes Historical Provider, MD  phenylephrine-shark liver oil-mineral oil-petrolatum (PREPARATION H) 0.25-3-14-71.9 % rectal ointment Place 1 application rectally 2 (two) times daily as needed for hemorrhoids.   Yes Historical Provider, MD  potassium chloride SA (K-DUR,KLOR-CON) 20 MEQ tablet Take 1 tablet (20 mEq total) by mouth daily. Mix 20 mEq in 4 ounces of water daily Patient taking differently: Take 60 mEq by mouth daily. May dissolve in small amount of water 07/08/13  Yes Claudette T Levie Heritage, NP  ranitidine (ZANTAC) 150 MG tablet Take 150 mg by mouth 2 (two) times daily.   Yes Historical Provider, MD  rivaroxaban (XARELTO) 20 MG TABS tablet Take 20 mg by mouth daily with supper.   Yes Historical Provider, MD  traZODone (DESYREL) 50 MG tablet Take 25 mg by mouth at bedtime.   Yes Historical Provider, MD  vitamin C  (ASCORBIC ACID) 500 MG tablet Take 500 mg by mouth daily.   Yes Historical Provider, MD  Homeopathic Products (ARNICA [place]) PLLT Take 5 each by mouth as directed. Take 5 tabs 3 times daily for 5 days then 5 pills once daily to reduce pain re: OA Patient not taking: Reported on 06/25/2016 11/14/13   Mardene Celeste, NP  Homeopathic Products (ARNICARE) GEL Apply 1 application topically as needed. Apply thin layer to right hip and right buttocks up to three times daily as needed. MAy apply to tender joints as needed to reduce pain re: OA Patient not taking: Reported on 06/25/2016 11/14/13   Mardene Celeste, NP  polyethylene glycol (MIRALAX / GLYCOLAX) packet 17 g in 8 ounces of fluid daily as needed for constipation. Patient not taking: Reported on 06/25/2016 11/12/13   Estill Dooms, MD  trolamine salicylate (MYOFLEX) 10 % cream Apply 4 times daily to painful finger Patient not taking: Reported on 06/25/2016 12/24/13   Estill Dooms, MD  Family History Family History  Problem Relation Age of Onset  . Alzheimer's disease Mother   . Cancer Father     lung  . Cancer Sister   . Cancer Brother   . Cancer Brother     Social History Social History  Substance Use Topics  . Smoking status: Former Smoker    Quit date: 12/26/1958  . Smokeless tobacco: Never Used  . Alcohol use 0.6 oz/week    1 Glasses of wine per week     Comment: 1 most nights     Allergies   Simvastatin; Crestor [rosuvastatin calcium]; Lipitor [atorvastatin]; and Statins   Review of Systems Review of Systems  Constitutional: Negative for chills and fever.  HENT: Negative.   Respiratory: Negative for cough and shortness of breath.   Cardiovascular: Negative for chest pain and palpitations.  Gastrointestinal: Positive for abdominal pain and blood in stool. Negative for constipation, diarrhea, nausea and vomiting.  Genitourinary: Negative for dysuria, flank pain, frequency, hematuria and urgency.    Musculoskeletal: Negative.   Skin: Negative.  Negative for pallor.  Neurological: Positive for light-headedness. Negative for dizziness, syncope, weakness, numbness and headaches.  All other systems reviewed and are negative.    Physical Exam Updated Vital Signs BP (!) 146/103   Pulse 71   Temp 98 F (36.7 C) (Oral)   Resp 15   Ht 5\' 4"  (1.626 m)   Wt 61.7 kg   SpO2 100%   BMI 23.34 kg/m   Physical Exam  Constitutional: He is oriented to person, place, and time. He appears well-developed and well-nourished. No distress.  HENT:  Head: Normocephalic and atraumatic.  Mouth/Throat: Oropharynx is clear and moist.  Eyes: Conjunctivae and EOM are normal. Pupils are equal, round, and reactive to light. Right eye exhibits no discharge. Left eye exhibits no discharge. No scleral icterus.  Conjunctive without pallor.  Neck: Normal range of motion. Neck supple. No thyromegaly present.  Cardiovascular: Normal rate, normal heart sounds and intact distal pulses.  An irregularly irregular rhythm present. Exam reveals no friction rub.   No murmur heard. Pulses:      Radial pulses are 2+ on the right side, and 2+ on the left side.       Dorsalis pedis pulses are 2+ on the right side, and 2+ on the left side.  History of afib and on blood thinner.  Pulmonary/Chest: Effort normal and breath sounds normal.  Abdominal: Soft. Bowel sounds are normal. He exhibits no distension. There is tenderness in the suprapubic area. There is no rigidity, no rebound, no guarding, no CVA tenderness, no tenderness at McBurney's point and negative Murphy's sign.  Genitourinary: Rectal exam shows external hemorrhoid and guaiac positive stool. Rectal exam shows no internal hemorrhoid, no fissure, no mass, no tenderness and anal tone normal.  Genitourinary Comments: Chaperon present for exam. Bright read blood and soft stool noted in diaper and in rectal vault.  Musculoskeletal: Normal range of motion.   Lymphadenopathy:    He has no cervical adenopathy.  Neurological: He is alert and oriented to person, place, and time.  Skin: Skin is warm and dry. Capillary refill takes less than 2 seconds. No pallor.  Nursing note and vitals reviewed.    ED Treatments / Results  Labs (all labs ordered are listed, but only abnormal results are displayed) Labs Reviewed  COMPREHENSIVE METABOLIC PANEL - Abnormal; Notable for the following:       Result Value   Chloride 98 (*)  CO2 33 (*)    BUN 21 (*)    Total Bilirubin 1.4 (*)    All other components within normal limits  POC OCCULT BLOOD, ED - Abnormal; Notable for the following:    Fecal Occult Bld POSITIVE (*)    All other components within normal limits  CBC  TYPE AND SCREEN  ABO/RH    EKG  EKG Interpretation None       Radiology No results found.  Procedures Procedures (including critical care time)  Medications Ordered in ED Medications - No data to display   Initial Impression / Assessment and Plan / ED Course  I have reviewed the triage vital signs and the nursing notes.  Pertinent labs & imaging results that were available during my care of the patient were reviewed by me and considered in my medical decision making (see chart for details).  Clinical Course  Patient presented to the ED with rectal bleeding. He was also noted to have suprapubic abd pain. He was noted to have distended bladder. Bladder scanner with 900cc of urine. Foley catheter was inserted with 1L of urine drained and no recurrence of abd pain. Hemocult was positive. Hgb was 14.6. [name] hemodynamically stable. Spoke with Dr. Bevely Palmer for hospital admission. Patient has seen [facility] in the past and I consulted with them as well and they agree to consult. Patient was seen and evaluated by Dr. Alvino Chapel who agrees with the above plan.   Final Clinical Impressions(s) / ED Diagnoses   Final diagnoses:  Gastrointestinal hemorrhage, unspecified  gastrointestinal hemorrhage type    New Prescriptions New Prescriptions   No medications on file     Doristine Devoid, PA-C 06/27/16 2141    Davonna Belling, MD 06/30/16 [phone]

## 2016-06-25 NOTE — Consult Note (Addendum)
Consultation  Referring Provider: Dr. Waldron Labs   Primary Care Physician:  Mathews Argyle, MD Primary Gastroenterologist: Dr. Henrene Pastor    Reason for Consultation: GI Bleed            HPI:   Wyatt Perkins is a 82 y.o. maleWith a past history significant for A. Fib maintained on Xarelto, GERD, hyperlipidemia, hypertension, constipation and hypothyroidism who presented to the ED accompanied by one of his caregivers today with a complaint of bright red blood per rectum.  [name] pleasantly demented and is unable to provide much of his history, history is garnered from one of his caretakers who is by his bedside. She describes that per report the patient started with bright red blood per rectum on Wednesday night this was accompanied by some clots, apparently this continued into Thursday morning whenever the patient had a bowel movement which was "I think a few times a day". Again this morning the patient had a bowel movement with a large amount of bright red blood mixed in. Since arriving in the ED patient has also had another episode of bright red blood per rectum with some clots. This was visualized by the ED provider. Associated symptoms included a complaint of dizziness this a.m. per the caregiver and also some abdominal pain, which the patient currently denies.  Recent medical history includes a diagnosis of pneumonia 1-1/2 weeks ago by the patient's PCP, since that time the patient has been eating half of his normal diet. Remote history of prostate cancer? For which the patient received radiation. No previous history of GI bleed.  Patient and caregiver deny fever, chills, heartburn, reflux, use of NSAIDs, change in bowel habits previously, constipation, diarrhea or previous episodes of the same.  ED course: Hemoccult-positive stool, elevated BUN 21, hemoglobin normal at 14.6, patient did have a urinary cath placed with 900 mL of output   Recent GI history: 05/03/13-swallowing eval  is [facility] pathology: impression:Mild oral phase dysphagia, moderate cervical esophageal phase dysphagia, suspected primary esophageal dysphagia, mild pharyngeal phase dysphagia 01/14/2003-colonoscopy, Dr. Perry-impression:Diverticulosis of the sigmoid colon 01/14/2003-EGD, Dr. Perry-impression:GERD, normal exam  Past Medical History:  Diagnosis Date  . Abnormality of gait 2011  . Anxiety state, unspecified 2013  . Atrial fibrillation ([place]) 2013  . Carpal tunnel syndrome 2011  . Cataract   . Cervicalgia 2013  . Debility, unspecified 09/01/2012  . Disturbance of skin sensation 2011  . Dysrhythmia    atrial fibrillation  . GERD (gastroesophageal reflux disease) 12/20/2012  . Height loss   . Hyperlipemia   . Hypertension   . Loss of weight 10/25/2012  . Lumbago 2007  . Malignant neoplasm of prostate ([place]) [id number]  . Myalgia and myositis, unspecified 2011  . Nocturia 2013  . Open wound of forehead, without mention of complication [id number]  . Open wound of forehead, without mention of complication [id number]  . Other atopic dermatitis and related conditions 2013  . Other bursitis disorders 2012  . Other specified disease of sebaceous glands 2012  . Rash and other nonspecific skin eruption   . Reflux esophagitis 2007  . Sebaceous cyst 2012  . Shortness of breath 2013  . Unspecified constipation 03/29/2013  . Unspecified hypothyroidism 2011  . Unspecified vitamin D deficiency 2011  . Urge incontinence 2010    Past Surgical History:  Procedure Laterality Date  . APPENDECTOMY  2000  . CARDIOVERSION N/A 10/10/2012   Procedure: CARDIOVERSION;  Surgeon: Laverda Page, MD;  Location: [facility];  Service: Cardiovascular;  Laterality: N/A;  . CATARACT EXTRACTION W/ INTRAOCULAR LENS  IMPLANT, BILATERAL  2007, 2009  . EYE SURGERY  2007-2009   extraction/IOL implants  . LEG SURGERY     left    Family History  Problem Relation Age of Onset  . Alzheimer's disease Mother   . Cancer  Father     lung  . Cancer Sister   . Cancer Brother   . Cancer Brother     Social History  Substance Use Topics  . Smoking status: Former Smoker    Quit date: 12/26/1958  . Smokeless tobacco: Never Used  . Alcohol use 0.6 oz/week    1 Glasses of wine per week     Comment: 1 most nights    Prior to Admission medications   Medication Sig Start Date End Date Taking? Authorizing Provider  acetaminophen (TYLENOL) 325 MG tablet Take 650 mg by mouth at bedtime. And may Take 650mg  by mouth every 6 hours as needed for headache/pain   Yes Historical Provider, MD  antiseptic oral rinse (BIOTENE) LIQD 15 mLs by Mouth Rinse route 3 (three) times daily before meals. Before meals    Yes Historical Provider, MD  atenolol (TENORMIN) 50 MG tablet Take 50 mg by mouth daily.   Yes Historical Provider, MD  calcium carbonate (TUMS - DOSED IN MG ELEMENTAL CALCIUM) 500 MG chewable tablet Chew 1 tablet by mouth 3 (three) times daily as needed for indigestion or heartburn.   Yes Historical Provider, MD  Cholecalciferol 2000 UNITS CAPS Take 2,000 Units by mouth daily. Take one each morning Vitamin D    Yes Historical Provider, MD  furosemide (LASIX) 20 MG tablet Take 20 mg by mouth daily.  05/07/13  Yes Historical Provider, MD  isosorbide mononitrate (IMDUR) 60 MG 24 hr tablet Take 60 mg by mouth daily.   Yes Historical Provider, MD  loperamide (IMODIUM) 2 MG capsule Take 2 mg by mouth 2 (two) times daily as needed for diarrhea or loose stools.   Yes Historical Provider, MD  LORazepam (ATIVAN) 0.5 MG tablet Take 0.25 mg by mouth daily as needed for anxiety.   Yes Historical Provider, MD  Melatonin 5 MG TABS Take 5 mg by mouth at bedtime.   Yes Historical Provider, MD  Multiple Vitamins-Minerals (MULTIVITAMIN WITH MINERALS) tablet Take 1 tablet by mouth daily. Take one daily as nutritional supplement.   Yes Historical Provider, MD  PARoxetine (PAXIL) 10 MG tablet Take 10 mg by mouth daily.   Yes Historical Provider,  MD  phenylephrine-shark liver oil-mineral oil-petrolatum (PREPARATION H) 0.25-3-14-71.9 % rectal ointment Place 1 application rectally 2 (two) times daily as needed for hemorrhoids.   Yes Historical Provider, MD  potassium chloride SA (K-DUR,KLOR-CON) 20 MEQ tablet Take 1 tablet (20 mEq total) by mouth daily. Mix 20 mEq in 4 ounces of water daily Patient taking differently: Take 60 mEq by mouth daily. May dissolve in small amount of water 07/08/13  Yes Claudette T Levie Heritage, NP  ranitidine (ZANTAC) 150 MG tablet Take 150 mg by mouth 2 (two) times daily.   Yes Historical Provider, MD  rivaroxaban (XARELTO) 20 MG TABS tablet Take 20 mg by mouth daily with supper.   Yes Historical Provider, MD  traZODone (DESYREL) 50 MG tablet Take 25 mg by mouth at bedtime.   Yes Historical Provider, MD  vitamin C (ASCORBIC ACID) 500 MG tablet Take 500 mg by mouth daily.   Yes Historical Provider, MD  Homeopathic Products (ARNICA [place]) PLLT  Take 5 each by mouth as directed. Take 5 tabs 3 times daily for 5 days then 5 pills once daily to reduce pain re: OA Patient not taking: Reported on 06/25/2016 11/14/13   Mardene Celeste, NP  Homeopathic Products (ARNICARE) GEL Apply 1 application topically as needed. Apply thin layer to right hip and right buttocks up to three times daily as needed. MAy apply to tender joints as needed to reduce pain re: OA Patient not taking: Reported on 06/25/2016 11/14/13   Mardene Celeste, NP  polyethylene glycol (MIRALAX / GLYCOLAX) packet 17 g in 8 ounces of fluid daily as needed for constipation. Patient not taking: Reported on 06/25/2016 11/12/13   Estill Dooms, MD  trolamine salicylate (MYOFLEX) 10 % cream Apply 4 times daily to painful finger Patient not taking: Reported on 06/25/2016 12/24/13   Estill Dooms, MD    No current facility-administered medications for this encounter.    Current Outpatient Prescriptions  Medication Sig Dispense Refill  . acetaminophen (TYLENOL) 325 MG  tablet Take 650 mg by mouth at bedtime. And may Take 650mg  by mouth every 6 hours as needed for headache/pain    . antiseptic oral rinse (BIOTENE) LIQD 15 mLs by Mouth Rinse route 3 (three) times daily before meals. Before meals     . atenolol (TENORMIN) 50 MG tablet Take 50 mg by mouth daily.    . calcium carbonate (TUMS - DOSED IN MG ELEMENTAL CALCIUM) 500 MG chewable tablet Chew 1 tablet by mouth 3 (three) times daily as needed for indigestion or heartburn.    . Cholecalciferol 2000 UNITS CAPS Take 2,000 Units by mouth daily. Take one each morning Vitamin D     . furosemide (LASIX) 20 MG tablet Take 20 mg by mouth daily.     . isosorbide mononitrate (IMDUR) 60 MG 24 hr tablet Take 60 mg by mouth daily.    Marland Kitchen loperamide (IMODIUM) 2 MG capsule Take 2 mg by mouth 2 (two) times daily as needed for diarrhea or loose stools.    Marland Kitchen LORazepam (ATIVAN) 0.5 MG tablet Take 0.25 mg by mouth daily as needed for anxiety.    . Melatonin 5 MG TABS Take 5 mg by mouth at bedtime.    . Multiple Vitamins-Minerals (MULTIVITAMIN WITH MINERALS) tablet Take 1 tablet by mouth daily. Take one daily as nutritional supplement.    Marland Kitchen PARoxetine (PAXIL) 10 MG tablet Take 10 mg by mouth daily.    . phenylephrine-shark liver oil-mineral oil-petrolatum (PREPARATION H) 0.25-3-14-71.9 % rectal ointment Place 1 application rectally 2 (two) times daily as needed for hemorrhoids.    . potassium chloride SA (K-DUR,KLOR-CON) 20 MEQ tablet Take 1 tablet (20 mEq total) by mouth daily. Mix 20 mEq in 4 ounces of water daily (Patient taking differently: Take 60 mEq by mouth daily. May dissolve in small amount of water)    . ranitidine (ZANTAC) 150 MG tablet Take 150 mg by mouth 2 (two) times daily.    . rivaroxaban (XARELTO) 20 MG TABS tablet Take 20 mg by mouth daily with supper.    . traZODone (DESYREL) 50 MG tablet Take 25 mg by mouth at bedtime.    . vitamin C (ASCORBIC ACID) 500 MG tablet Take 500 mg by mouth daily.    . Homeopathic  Products (ARNICA [place]) PLLT Take 5 each by mouth as directed. Take 5 tabs 3 times daily for 5 days then 5 pills once daily to reduce pain re: OA (Patient not taking:  Reported on 06/25/2016)    . Homeopathic Products (ARNICARE) GEL Apply 1 application topically as needed. Apply thin layer to right hip and right buttocks up to three times daily as needed. MAy apply to tender joints as needed to reduce pain re: OA (Patient not taking: Reported on 06/25/2016)    . polyethylene glycol (MIRALAX / GLYCOLAX) packet 17 g in 8 ounces of fluid daily as needed for constipation. (Patient not taking: Reported on 06/25/2016) 14 each 5  . trolamine salicylate (MYOFLEX) 10 % cream Apply 4 times daily to painful finger (Patient not taking: Reported on 06/25/2016) 100 g 5    Allergies as of 06/25/2016 - Review Complete 06/25/2016  Allergen Reaction Noted  . Simvastatin  10/10/2012  . Crestor [rosuvastatin calcium] Other (See Comments) 06/25/2016  . Lipitor [atorvastatin] Other (See Comments) 06/25/2016  . Statins  01/10/2013     Review of Systems:     Constitutional: No weight loss, fever, chills, weakness or fatigue HEENT: Eyes: No change in vision               Ears, Nose, Throat:  No change in hearing or congestion Skin: No rash or itching Cardiovascular: No chest pain, chest pressure or palpitations   Respiratory: No SOB or cough Gastrointestinal: See HPI and otherwise negative Genitourinary: Positive for decrease in urinary frequency No dysuria  Neurological:  Positive for dizziness No headache or syncope Musculoskeletal: No new muscle or joint pain Hematologic: No bruising Psychiatric:  Positive history of dementiaNo history of depression or anxiety    Physical Exam:  Vital signs in last 24 hours: Temp:  [98 F (36.7 C)] 98 F (36.7 C) (10/27 1212) Pulse Rate:  [71-75] 75 (10/27 1440) Resp:  [15-16] 15 (10/27 1440) BP: (146-154)/(103-107) 154/107 (10/27 1440) SpO2:  [100 %] 100 % (10/27  1440) Weight:  [136 lb (61.7 kg)-136 lb 12.8 oz (62.1 kg)] 136 lb (61.7 kg) (10/27 1233)   General:   Pleasantly demented elderly Caucasian male appears to be in NAD, Well developed, Well nourished, alert and cooperative. Sitting in bed picking at his various lines and trying to remove his catheter. Head:  Normocephalic and atraumatic. Eyes:   PEERL, EOMI. No icterus.  Ears:  Normal auditory acuity. Neck:  Supple Throat: Oral cavity and pharynx without inflammation, swelling or lesion.  Lungs: Respirations even and unlabored. Lungs clear to auscultation bilaterally.   No wheezes, crackles, or rhonchi.  Heart: Normal S1, S2. No MRG. Regular rate and rhythm. No peripheral edema, cyanosis or pallor.  Abdomen:  Soft,  mild distention nontender. No rebound or guarding. Normal bowel sounds. No appreciable masses or hepatomegaly. Rectal:  Not performed (Per ED physician large amount of bright red blood at the time of his exam) Msk:  Symmetrical without gross deformities.  Extremities:  Without edema, no deformity or joint abnormality. Normal ROM. Neurologic:  Alert;  grossly normal neurologically.   Skin:   Dry and intact without significant lesions or rashes. Psychiatric:  Demented   LAB RESULTS:  Recent Labs  06/25/16 1232  WBC 9.8  HGB 14.6  HCT 45.6  PLT 288   BMET  Recent Labs  06/25/16 1232  NA 138  K 4.1  CL 98*  CO2 33*  GLUCOSE 91  BUN 21*  CREATININE 0.96  CALCIUM 9.5   LFT  Recent Labs  06/25/16 1232  PROT 7.3  ALBUMIN 4.4  AST 39  ALT 29  ALKPHOS 91  BILITOT 1.4*   PT/INR No  results for input(s): LABPROT, INR in the last 72 hours.  STUDIES: No results found.   PREVIOUS ENDOSCOPIES:            See HPI   Impression / Plan:   Impression: 1. GI Bleed: Patient's caregivers report multiple episodes of bright red blood per rectum which started Wednesday night, 06/23/16 , does have a history of diverticula on colonoscopy in 2004, patient is also  status post radiation for prostate cancer, maintained on Xarelto for A. Fib, Hemoglobin stable  at 14.6, Hemoccult positive stool ; consider most likely diverticular bleed versus radiation proctitis versus other   Plan:  1. At this time patient appears stable, will await recent repeat hemoglobin results 2. Continue to monitor hemoglobin every 6 hours with transfusion as needed for less than 7 3. Continue supportive measures 4. If continues with acute GI bleed, could consider endoscopic procedures versus bleeding scan 5. Hold patient's Xarelto 6. Please await any further recommendations from Dr. Loletha Carrow  Thank you for your kind consultation, we will continue to follow.  Lavone Nian Lemmon  06/25/2016, 3:50 PM Pager #: [phone]  I have reviewed the entire case in detail with the above APP and discussed the plan in detail.  Therefore, I agree with the diagnoses recorded above. In addition,  I have personally interviewed and examined the patient and have personally reviewed any abdominal/pelvic CT scan images.  My additional thoughts are as follows: add'l Dx: A fib, on [place]  I saw this patient with three of his usual caregivers present.  He just passed some more BRBPR and is confused and agitated. His abdominal exam is benign.  Hgb remained normal on recheck this evening.  This doesn't seem like as much blood loss as one would expect from diverticular bleeding.  Perhaps XRT proctopathy or something like a stercoral ulcer in the rectum.  It seems like it would be extremely difficult to even prep him for a colonoscopy, much less perform one with its risks and his agitation.  Hopefully the bleeding will stop as the [facility] is out of his system.  Then perhaps a sigmoidoscopy could be feasible, but would still need to identify his medical POA.  If bleeding stops but procedures cannot be performed, perhaps the use of [place] in the patient should be reconsidered.   Total time 40 minutes, over half  spent in chart review and counseling re: care plan.  Nelida Meuse III Pager [phone]  Mon-Fri 8a-5p [phone] after 5p, weekends, holidays

## 2016-06-25 NOTE — ED Notes (Signed)
Hospitalist MD at bedside. 

## 2016-06-25 NOTE — Progress Notes (Signed)
PHARMACIST - PHYSICIAN ORDER COMMUNICATION  CONCERNING: P&T Medication Policy on Herbal Medications  DESCRIPTION:  This patient's order for:  Melatonin  has been noted.  This product(s) is classified as an "herbal" or natural product. Due to a lack of definitive safety studies or FDA approval, nonstandard manufacturing practices, plus the potential risk of unknown drug-drug interactions while on inpatient medications, the Pharmacy and Therapeutics Committee does not permit the use of "herbal" or natural products of this type within [facility].   ACTION TAKEN: The pharmacy department is unable to verify this order at this time and your patient has been informed of this safety policy. Please reevaluate patient's clinical condition at discharge and address if the herbal or natural product(s) should be resumed at that time.  Dia Sitter, PharmD, BCPS 06/25/2016 5:17 PM

## 2016-06-25 NOTE — H&P (Signed)
TRH H&P   Patient Demographics:    Wyatt Perkins, is a 82 y.o. male  MRN: [id number]   DOB - October 27, 1925  Admit Date - 06/25/2016  Outpatient Primary MD for the patient is Mathews Argyle, MD  Referring MD/NP/PA: Dr Laverta Baltimore  Patient coming from: [facility]  Chief Complaint  Patient presents with  . Rectal Bleeding      HPI:    Wyatt Perkins  is a 82 y.o. male, With past medical history of atrial fibrillation on Xarelto, dementia, hyperlipidemia, hypertension, prostate cancer status post radiation seeds, patient was brought from facility for bright red blood per rectum, patient is a poor historian secondary to dementia, history obtained from CNA at bedside, daughter via phone, and ED records, patient started to have bright red blood per rectum Wednesday evening, so far 2-3 episodes, patient on Xarelto for A. fib, patient denies any chest pain, shortness of breath, dyspnea, he has complaints of abdominal pain on admission, found to have distended bladder, Foley catheter inserted with 1 L drained, with no recurrence of abdominal pain - In ED workup was significant for hemoglobin of 14.6, patient was noticed to have bright blood in stool, hospitalist requested to admit    Review of systems:    In addition to the HPI above No Fever-chills, No Headache, No changes with Vision or hearing, No problems swallowing food or Liquids, No Chest pain, Cough or Shortness of Breath, No Nausea or Vommitting, Bowel movements are regular,Had complaints of abdominal pain which resolved after Foley insertion No Blood in Urine, had bright red blood per rectum No dysuria, No new skin rashes or bruises, No new joints pains-aches,  No new weakness, tingling, numbness in any extremity, No recent weight gain or loss, No polyuria, polydypsia or polyphagia, No significant Mental Stressors.  A  full 10 point Review of Systems was done, except as stated above, all other Review of Systems were negative.   With Past History of the following :    Past Medical History:  Diagnosis Date  . Abnormality of gait 2011  . Anxiety state, unspecified 2013  . Atrial fibrillation ([place]) 2013  . Carpal tunnel syndrome 2011  . Cataract   . Cervicalgia 2013  . Debility, unspecified 09/01/2012  . Disturbance of skin sensation 2011  . Dysrhythmia    atrial fibrillation  . GERD (gastroesophageal reflux disease) 12/20/2012  . Height loss   . Hyperlipemia   . Hypertension   . Loss of weight 10/25/2012  . Lumbago 2007  . Malignant neoplasm of prostate ([place]) [id number]  . Myalgia and myositis, unspecified 2011  . Nocturia 2013  . Open wound of forehead, without mention of complication [id number]  . Open wound of forehead, without mention of complication [id number]  . Other atopic dermatitis and related conditions 2013  . Other bursitis disorders 2012  . Other specified disease of sebaceous glands 2012  .  Rash and other nonspecific skin eruption   . Reflux esophagitis 2007  . Sebaceous cyst 2012  . Shortness of breath 2013  . Unspecified constipation 03/29/2013  . Unspecified hypothyroidism 2011  . Unspecified vitamin D deficiency 2011  . Urge incontinence 2010      Past Surgical History:  Procedure Laterality Date  . APPENDECTOMY  2000  . CARDIOVERSION N/A 10/10/2012   Procedure: CARDIOVERSION;  Surgeon: Laverda Page, MD;  Location: [facility];  Service: Cardiovascular;  Laterality: N/A;  . CATARACT EXTRACTION W/ INTRAOCULAR LENS  IMPLANT, BILATERAL  2007, 2009  . EYE SURGERY  2007-2009   extraction/IOL implants  . LEG SURGERY     left      Social History:     Social History  Substance Use Topics  . Smoking status: Former Smoker    Quit date: 12/26/1958  . Smokeless tobacco: Never Used  . Alcohol use 0.6 oz/week    1 Glasses of wine per week     Comment: 1 most nights       Lives - At [facility]  Mobility - With assistance    Family History :     Family History  Problem Relation Age of Onset  . Alzheimer's disease Mother   . Cancer Father     lung  . Cancer Sister   . Cancer Brother   . Cancer Brother       Home Medications:   Prior to Admission medications   Medication Sig Start Date End Date Taking? Authorizing Provider  acetaminophen (TYLENOL) 325 MG tablet Take 650 mg by mouth at bedtime. And may Take 650mg  by mouth every 6 hours as needed for headache/pain   Yes Historical Provider, MD  antiseptic oral rinse (BIOTENE) LIQD 15 mLs by Mouth Rinse route 3 (three) times daily before meals. Before meals    Yes Historical Provider, MD  atenolol (TENORMIN) 50 MG tablet Take 50 mg by mouth daily.   Yes Historical Provider, MD  calcium carbonate (TUMS - DOSED IN MG ELEMENTAL CALCIUM) 500 MG chewable tablet Chew 1 tablet by mouth 3 (three) times daily as needed for indigestion or heartburn.   Yes Historical Provider, MD  Cholecalciferol 2000 UNITS CAPS Take 2,000 Units by mouth daily. Take one each morning Vitamin D    Yes Historical Provider, MD  furosemide (LASIX) 20 MG tablet Take 20 mg by mouth daily.  05/07/13  Yes Historical Provider, MD  isosorbide mononitrate (IMDUR) 60 MG 24 hr tablet Take 60 mg by mouth daily.   Yes Historical Provider, MD  loperamide (IMODIUM) 2 MG capsule Take 2 mg by mouth 2 (two) times daily as needed for diarrhea or loose stools.   Yes Historical Provider, MD  LORazepam (ATIVAN) 0.5 MG tablet Take 0.25 mg by mouth daily as needed for anxiety.   Yes Historical Provider, MD  Melatonin 5 MG TABS Take 5 mg by mouth at bedtime.   Yes Historical Provider, MD  Multiple Vitamins-Minerals (MULTIVITAMIN WITH MINERALS) tablet Take 1 tablet by mouth daily. Take one daily as nutritional supplement.   Yes Historical Provider, MD  PARoxetine (PAXIL) 10 MG tablet Take 10 mg by mouth daily.   Yes Historical Provider, MD   phenylephrine-shark liver oil-mineral oil-petrolatum (PREPARATION H) 0.25-3-14-71.9 % rectal ointment Place 1 application rectally 2 (two) times daily as needed for hemorrhoids.   Yes Historical Provider, MD  potassium chloride SA (K-DUR,KLOR-CON) 20 MEQ tablet Take 1 tablet (20 mEq total) by mouth daily. Mix  20 mEq in 4 ounces of water daily Patient taking differently: Take 60 mEq by mouth daily. May dissolve in small amount of water 07/08/13  Yes Claudette T Levie Heritage, NP  ranitidine (ZANTAC) 150 MG tablet Take 150 mg by mouth 2 (two) times daily.   Yes Historical Provider, MD  rivaroxaban (XARELTO) 20 MG TABS tablet Take 20 mg by mouth daily with supper.   Yes Historical Provider, MD  traZODone (DESYREL) 50 MG tablet Take 25 mg by mouth at bedtime.   Yes Historical Provider, MD  vitamin C (ASCORBIC ACID) 500 MG tablet Take 500 mg by mouth daily.   Yes Historical Provider, MD  Homeopathic Products (ARNICA [place]) PLLT Take 5 each by mouth as directed. Take 5 tabs 3 times daily for 5 days then 5 pills once daily to reduce pain re: OA Patient not taking: Reported on 06/25/2016 11/14/13   Mardene Celeste, NP  Homeopathic Products (ARNICARE) GEL Apply 1 application topically as needed. Apply thin layer to right hip and right buttocks up to three times daily as needed. MAy apply to tender joints as needed to reduce pain re: OA Patient not taking: Reported on 06/25/2016 11/14/13   Mardene Celeste, NP  polyethylene glycol (MIRALAX / GLYCOLAX) packet 17 g in 8 ounces of fluid daily as needed for constipation. Patient not taking: Reported on 06/25/2016 11/12/13   Estill Dooms, MD  trolamine salicylate (MYOFLEX) 10 % cream Apply 4 times daily to painful finger Patient not taking: Reported on 06/25/2016 12/24/13   Estill Dooms, MD     Allergies:     Allergies  Allergen Reactions  . Simvastatin     myalgias  . Crestor [Rosuvastatin Calcium] Other (See Comments)    Unknown reaction- on MAR  .  Lipitor [Atorvastatin] Other (See Comments)    Unknown reaction- on MAR  . Statins     All statins cause muscle cramps     Physical Exam:   Vitals  Blood pressure (!) 154/107, pulse 75, temperature 98 F (36.7 C), temperature source Oral, resp. rate 15, height 5\' 4"  (1.626 m), weight 61.7 kg (136 lb), SpO2 100 %.   1. General  Frail elderly male lying in bed in NAD,    2. Pleasant, communicative, confused, demented, awake alert 1.  3. No F.N deficits, ALL C.Nerves Intact, Strength 5/5 all 4 extremities, Sensation intact all 4 extremities, Plantars down going.  4. Ears and Eyes appear Normal, Conjunctivae clear, PERRLA. Moist Oral Mucosa.  5. Supple Neck, No JVD, No cervical lymphadenopathy appriciated, No Carotid Bruits.  6. Symmetrical Chest wall movement, Good air movement bilaterally, CTAB.  7. RRR, No Gallops, Rubs or Murmurs, No Parasternal Heave.  8. Positive Bowel Sounds, Abdomen Soft, No tenderness, No organomegaly appriciated,No rebound -guarding or rigidity.  9.  No Cyanosis, Normal Skin Turgor, No Skin Rash or Bruise.  10. Good muscle tone,  joints appear normal , no effusions, Normal ROM.  11. No Palpable Lymph Nodes in Neck or Axillae    Data Review:    CBC  Recent Labs Lab 06/25/16 1232  WBC 9.8  HGB 14.6  HCT 45.6  PLT 288  MCV 98.1  MCH 31.4  MCHC 32.0  RDW 13.6   ------------------------------------------------------------------------------------------------------------------  Chemistries   Recent Labs Lab 06/25/16 1232  NA 138  K 4.1  CL 98*  CO2 33*  GLUCOSE 91  BUN 21*  CREATININE 0.96  CALCIUM 9.5  AST 39  ALT 29  ALKPHOS  91  BILITOT 1.4*   ------------------------------------------------------------------------------------------------------------------ estimated creatinine clearance is 44.5 mL/min (by C-G formula based on SCr of 0.96  mg/dL). ------------------------------------------------------------------------------------------------------------------ No results for input(s): TSH, T4TOTAL, T3FREE, THYROIDAB in the last 72 hours.  Invalid input(s): FREET3  Coagulation profile No results for input(s): INR, PROTIME in the last 168 hours. ------------------------------------------------------------------------------------------------------------------- No results for input(s): DDIMER in the last 72 hours. -------------------------------------------------------------------------------------------------------------------  Cardiac Enzymes No results for input(s): CKMB, TROPONINI, MYOGLOBIN in the last 168 hours.  Invalid input(s): CK ------------------------------------------------------------------------------------------------------------------ No results found for: BNP   ---------------------------------------------------------------------------------------------------------------  Urinalysis No results found for: COLORURINE, APPEARANCEUR, LABSPEC, [place], GLUCOSEU, HGBUR, BILIRUBINUR, KETONESUR, PROTEINUR, UROBILINOGEN, NITRITE, LEUKOCYTESUR  ----------------------------------------------------------------------------------------------------------------   Imaging Results:    No results found.    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 - GI consult greatly appreciated, continue to monitor H&H closely and transfuse as  needed, continue to hold Xarelto, if continues with acute GI bleed, may need endoscopic procedure versus bleeding scan. - Continue with IV fluids, continue with clear liquid diet  Urinary retention - Requiring Foley catheter insertion, We'll start on Flomax, voiding trials and 1-2 days. - Patient with history of prostate cancer, will try to verify with daughter in a.m. who is his primary urologist.  Atrial fibrillation - Continue to hold Xarelto  Recently treated pneumonia - Daughter report patient recently treated at facility for pneumonia, but he was still needing oxygen when necessary, chest x-ray with possible opacity versus pneumonia, afebrile, leukocytosis, so will hold on antibiotic treatment, will try to wean off oxygen.  GERD - Continue with Pepcid  Hypertension - Blood pressure acceptable, continue with home medication  Hypothyroidism - Continue to hold Synthroid  Chronic diastolic CHF - Appears euvolemic, continue to hold Lasix given GI bleed  DVT Prophylaxis  SCDs  AM Labs Ordered, also please review Full Orders  Family Communication: Admission, patients condition and plan of care including tests being ordered have been discussed with the patient and daughter via phone who indicate understanding and agree with the plan and Code Status.  Code Status DNR, patient has DO NOT RESUSCITATE form from facility, as was performed by daughter  Likely DC to  Back to ALF  Condition GUARDED    Consults called: GI  Admission status: Observation  Time spent in minutes : 60 minutes   Cosimo Schertzer M.D on 06/25/2016 at 3:48 PM  Between 7am to 7pm - Pager - [phone]. After 7pm go to [email or web address] - password [facility]  [facility] - Office  [phone]

## 2016-06-25 NOTE — ED Triage Notes (Signed)
Per [facility] CNA patient has had rectal bleeding with large clots since last night.  Reports whenever he sits he complains of pain and has had abdominal pain.  States he has been lightheaded as well.  Denies nausea/vomiting.

## 2016-06-25 NOTE — Clinical Social Work Note (Signed)
Clinical Social Work Assessment  Patient Details  Name: Wyatt Perkins MRN: [id number] Date of Birth: 1926-07-08  Date of referral:  06/25/16               Reason for consult:  Discharge Planning                Permission sought to share information with:  Facility Art therapist granted to share information::  Yes, Verbal Permission Granted  Name::        Agency::     Relationship::     Contact Information:     Housing/Transportation Living arrangements for the past 2 months:  [place] of Information:  Patient Patient Interpreter Needed:  None Criminal Activity/Legal Involvement Pertinent to Current Situation/Hospitalization:    Significant Relationships:  Adult Children (Daughter-Wyatt Perkins) Lives with:  Facility Resident Do you feel safe going back to the place where you live?  Yes Need for family participation in patient care:  Yes (Comment)  Care giving concerns:  CSW received call regarding discharge plans.    Social Worker assessment / plan:  CSW spoke with patients daughter, Wyatt Perkins, regarding discharge plans. [name] a current resident of [facility] assisted living and patient has plans to move into [facility] next week. [facility] is providing patient with a 24 hour sitter and will provide patient with transportation back to their facility. Patient daughter currently lives in [place] and is unable to be at the hospital at this time. Patients daughter, Wyatt Perkins, stated she is POA.   Employment status:  Retired Forensic scientist:  Medicare PT Recommendations:  Not assessed at this time Information / Referral to community resources:     Patient/Family's Response to care:  Patients daughter appreciated CSW.   Patient/Family's Understanding of and Emotional Response to Diagnosis, Current Treatment, and Prognosis:  Patients daughter understood current treatment and prognosis.   Emotional Assessment Appearance:   Appears stated age Attitude/Demeanor/Rapport:    Affect (typically observed):  Calm, Happy, Pleasant Orientation:  Oriented to Self Alcohol / Substance use:    Psych involvement (Current and /or in the community):  No (Comment)  Discharge Needs  Concerns to be addressed:  No discharge needs identified Readmission within the last 30 days:  No Current discharge risk:  None Barriers to Discharge:  No Barriers Identified   Wyatt Anna, LCSW 06/25/2016, 5:28 PM

## 2016-06-26 DIAGNOSIS — I480 Paroxysmal atrial fibrillation: Secondary | ICD-10-CM | POA: Diagnosis not present

## 2016-06-26 DIAGNOSIS — K5901 Slow transit constipation: Secondary | ICD-10-CM

## 2016-06-26 DIAGNOSIS — K625 Hemorrhage of anus and rectum: Secondary | ICD-10-CM | POA: Diagnosis not present

## 2016-06-26 DIAGNOSIS — K5791 Diverticulosis of intestine, part unspecified, without perforation or abscess with bleeding: Secondary | ICD-10-CM

## 2016-06-26 LAB — CBC
HCT: 43.9 % (ref 39.0–52.0)
Hemoglobin: 14.1 g/dL (ref 13.0–17.0)
MCH: 31.5 pg (ref 26.0–34.0)
MCHC: 32.1 g/dL (ref 30.0–36.0)
MCV: 98 fL (ref 78.0–100.0)
PLATELETS: 230 10*3/uL (ref 150–400)
RBC: 4.48 MIL/uL (ref 4.22–5.81)
RDW: 13.4 % (ref 11.5–15.5)
WBC: 10.1 10*3/uL (ref 4.0–10.5)

## 2016-06-26 LAB — BASIC METABOLIC PANEL
Anion gap: 7 (ref 5–15)
BUN: 18 mg/dL (ref 6–20)
CALCIUM: 8.8 mg/dL — AB (ref 8.9–10.3)
CHLORIDE: 100 mmol/L — AB (ref 101–111)
CO2: 30 mmol/L (ref 22–32)
CREATININE: 0.83 mg/dL (ref 0.61–1.24)
GFR calc non Af Amer: >60 mL/min (ref >60)
Glucose, Bld: 94 mg/dL (ref 65–99)
Potassium: 4.3 mmol/L (ref 3.5–5.1)
Sodium: 137 mmol/L (ref 135–145)

## 2016-06-26 MED ORDER — LORAZEPAM 0.5 MG PO TABS
0.5000 mg | ORAL_TABLET | Freq: Once | ORAL | Status: AC
  Administered 2016-06-26: 0.5 mg via ORAL

## 2016-06-26 MED ORDER — POLYETHYLENE GLYCOL 3350 17 G PO PACK
17.0000 g | PACK | Freq: Two times a day (BID) | ORAL | Status: DC
Start: 2016-06-26 — End: 2016-06-30
  Administered 2016-06-26: 17 g via ORAL
  Filled 2016-06-26 (×2): qty 1

## 2016-06-26 MED ORDER — LIP MEDEX EX OINT
TOPICAL_OINTMENT | CUTANEOUS | Status: AC
  Administered 2016-06-26: 18:00:00
  Filled 2016-06-26: qty 7

## 2016-06-26 MED ORDER — LORAZEPAM 2 MG/ML IJ SOLN
1.0000 mg | Freq: Once | INTRAMUSCULAR | Status: AC
  Administered 2016-06-26: 1 mg via INTRAVENOUS
  Filled 2016-06-26: qty 1

## 2016-06-26 MED ORDER — HALOPERIDOL LACTATE 5 MG/ML IJ SOLN
2.0000 mg | Freq: Once | INTRAMUSCULAR | Status: AC
  Administered 2016-06-26: 2 mg via INTRAVENOUS
  Filled 2016-06-26: qty 1

## 2016-06-26 NOTE — Progress Notes (Signed)
PROGRESS NOTE    Wyatt Perkins  [id number] DOB: 1925-10-24 DOA: 06/25/2016 PCP: Mathews Argyle, MD   Brief Narrative: 82 year old male with history of hypertension, dyslipidemia, dementia, atrial fibrillation on xarelto, prostate cancer status post radiation therapy presented with bright red blood per rectum.  Assessment & Plan:   #Lower GI bleed: Unknown if it is diverticular bleeding versus related with constipation, radiation proctitis. Hemoglobin is stable with no further episode of bleeding in the hospital. Evaluated by GI. Anticoagulation on hold.  -Continue an MI and bowel regimen -Diet and continue supportive care  #Acute urinary retention requiring Foley catheter insertion: Continue Flomax. We will do trial of void. Likely needs to follow-up with urologist outpatient.  #Paroxysmal atrial fibrillation: Continue to monitor heart rate. Anticoagulation on hold. May likely discontinue anticoagulation given GI bleed.  #Recently treated bacterial pneumonia with chronic hypoxic shortly failure on home oxygen: Continue breathing treatment and oxygen to maintain saturation.  #Hypertension: Continue atenolol, Imdur. Monitor blood pressure  #Dementia with behavioral issue mostly agitation: Continue with supportive care. Home care aide at bedside. Patient's mental status is around baseline as per home care aid and nurse.  DVT prophylaxis: SCD. Code Status: DO NOT RESUSCITATE Family Communication: No family present at bedside. Disposition Plan: Likely discharge to senior living in 1-2 days    Consultants:   Gastroenterology  Procedures: None Antimicrobials: None  Subjective: Patient was seen and examined at bedside. Patient was alert awake but not oriented. He denied nausea, vomiting, chest pain, shortness of breath or abdominal pain. Review of systems unreliable because of his dementia. Home care nurse at bedside   Objective: Vitals:   06/25/16 1617 06/25/16 1639  06/25/16 2100 06/26/16 1356  BP: (!) 165/114 (!) 168/114 (!) 144/92 (!) 133/98  Pulse: 89 97 83 86  Resp: 17 18 18 18   Temp:  97.7 F (36.5 C)  98 F (36.7 C)  TempSrc:  Oral  Axillary  SpO2: 100% 98% 99% 97%  Weight:  60.4 kg (133 lb 2.5 oz)    Height:  5\' 4"  (1.626 m)      Intake/Output Summary (Last 24 hours) at 06/26/16 1518 Last data filed at 06/26/16 0025  Gross per 24 hour  Intake              210 ml  Output              575 ml  Net             -365 ml   Filed Weights   06/25/16 1212 06/25/16 1233 06/25/16 1639  Weight: 62.1 kg (136 lb 12.8 oz) 61.7 kg (136 lb) 60.4 kg (133 lb 2.5 oz)    Examination:  General exam: Elderly demented male lying comfortably. Respiratory system: Clear to auscultation. Respiratory effort normal.  Cardiovascular system: S1 & S2 heard, RRR.  No pedal edema. Gastrointestinal system: Abdomen is nondistended, soft and nontender. Normal bowel sounds heard. Central nervous system: Alert, awake and not oriented Extremities: Symmetric 5 x 5 power. Skin: No rashes, lesions or ulcers Psychiatry: Has dementia     Data Reviewed: I have personally reviewed following labs and imaging studies  CBC:  Recent Labs Lab 06/25/16 1232 06/25/16 1745 06/25/16 2244 06/26/16 0858  WBC 9.8  --   --  10.1  HGB 14.6 14.0 13.4 14.1  HCT 45.6 43.1 41.3 43.9  MCV 98.1  --   --  98.0  PLT 288  --   --  230  Basic Metabolic Panel:  Recent Labs Lab 06/25/16 1232 06/26/16 0858  NA 138 137  K 4.1 4.3  CL 98* 100*  CO2 33* 30  GLUCOSE 91 94  BUN 21* 18  CREATININE 0.96 0.83  CALCIUM 9.5 8.8*   GFR: Estimated Creatinine Clearance: 51.5 mL/min (by C-G formula based on SCr of 0.83 mg/dL). Liver Function Tests:  Recent Labs Lab 06/25/16 1232  AST 39  ALT 29  ALKPHOS 91  BILITOT 1.4*  PROT 7.3  ALBUMIN 4.4   No results for input(s): LIPASE, AMYLASE in the last 168 hours. No results for input(s): AMMONIA in the last 168 hours. Coagulation  Profile: No results for input(s): INR, PROTIME in the last 168 hours. Cardiac Enzymes: No results for input(s): CKTOTAL, CKMB, CKMBINDEX, TROPONINI in the last 168 hours. BNP (last 3 results) No results for input(s): PROBNP in the last 8760 hours. HbA1C: No results for input(s): HGBA1C in the last 72 hours. CBG: No results for input(s): GLUCAP in the last 168 hours. Lipid Profile: No results for input(s): CHOL, HDL, LDLCALC, TRIG, CHOLHDL, LDLDIRECT in the last 72 hours. Thyroid Function Tests: No results for input(s): TSH, T4TOTAL, FREET4, T3FREE, THYROIDAB in the last 72 hours. Anemia Panel: No results for input(s): VITAMINB12, FOLATE, FERRITIN, TIBC, IRON, RETICCTPCT in the last 72 hours. Sepsis Labs: No results for input(s): PROCALCITON, LATICACIDVEN in the last 168 hours.  No results found for this or any previous visit (from the past 240 hour(s)).       Radiology Studies: Dg Chest 1 View  Result Date: 06/25/2016 CLINICAL DATA:  Low oxygen.  Pneumonia.  Rectal bleeding. EXAM: CHEST 1 VIEW COMPARISON:  06/04/2015; 07/01/2014; 08/29/2012 FINDINGS: Grossly unchanged enlarged cardiac silhouette and mediastinal contours with prominence of the central pulmonary vasculature and atherosclerotic plaque within the aortic arch. Decreased lung volumes with worsening bibasilar opacities, left greater than right. There is chronic blunting of right costophrenic angle without definite pleural effusion. No pneumothorax. No definite evidence of edema. No acute osseus abnormalities. IMPRESSION: 1. Slightly decreased lung volumes with worsening bibasilar opacities, left greater than right, atelectasis versus infiltrate. Further evaluation with a PA and lateral chest radiograph may be obtained as clinically indicated. 2. Cardiomegaly without evidence of edema. 3.  Aortic Atherosclerosis (ICD10-170.0) Electronically Signed   By: Sandi Mariscal M.D.   On: 06/25/2016 16:06        Scheduled Meds: .  atenolol  50 mg Oral Daily  . cholecalciferol  2,000 Units Oral Daily  . famotidine  20 mg Oral Daily  . feeding supplement  1 Container Oral TID BM  . isosorbide mononitrate  60 mg Oral Daily  . PARoxetine  10 mg Oral Daily  . polyethylene glycol  17 g Oral BID  . potassium chloride SA  20 mEq Oral Daily  . tamsulosin  0.4 mg Oral QPC supper  . traZODone  25 mg Oral QHS   Continuous Infusions:    LOS: 0 days    Time spent: 28 minutes.    Dron Tanna Furry, MD [facility] Pager [phone]  If 7PM-7AM, please contact night-coverage [email or web address] Password [email or web address] 06/26/2016, 3:18 PM

## 2016-06-26 NOTE — Progress Notes (Addendum)
Patient trying to pull out IV, foley, telemetry lines, and trying to get OOB. Placed mittens & paged Night MD coverage. New order placed for 2mg  haldol IV. Administered to patient.

## 2016-06-26 NOTE — Progress Notes (Addendum)
Patient still agitated and trying to pull out IV, foley and trying to get OOB. Paged MD. New order for 1mg  IV ativan. Administered ativan. [name] now resting comfortably.

## 2016-06-26 NOTE — Progress Notes (Signed)
[facility] Progress Note  Chief Complaint: rectal bleeding  Subjective  History:  Patient confused and agitated, requiring mitts and constant attention from his caregiver.  He cannot give any history.  Still on clear liquid diet.  Continues to have small volume rectal bleeding every few hours. Caregiver tells me that his children have expressed concern about any procedure requiring sedation because their mother died under anesthesia.  ROS: UTO due to mental status  Objective:  Med list reviewed  Vital signs in last 24 hrs: Vitals:   06/25/16 1639 06/25/16 2100  BP: (!) 168/114 (!) 144/92  Pulse: 97 83  Resp: 18 18  Temp: 97.7 F (36.5 C)     Physical Exam    Cardiac: RRR . no peripheral edema  Pulm: seems clear - difficult to tell.  Not cooperative with exam  Abdomen: soft, no tenderness, with active bowel sounds. No guarding or palpable hepatosplenomegaly  Rectal:  He is impacted with soft, rust-colored stool  Recent Labs:   Recent Labs Lab 06/25/16 1232 06/25/16 1745 06/25/16 2244 06/26/16 0858  WBC 9.8  --   --  10.1  HGB 14.6 14.0 13.4 14.1  HCT 45.6 43.1 41.3 43.9  PLT 288  --   --  230    Recent Labs Lab 06/25/16 1232 06/26/16 0858  NA 138 137  K 4.1 4.3  CL 98* 100*  CO2 33* 30  BUN 21* 18  ALBUMIN 4.4  --   ALKPHOS 91  --   ALT 29  --   AST 39  --   GLUCOSE 91 94   No results for input(s): INR in the last 168 hours.   @ASSESSMENTPLANBEGIN @ Assessment:  Rectal bleeding - appears to be due to obstipation   Plan: Stop frequent Hgb checks He can eat and stop IVF Orders to manually disimpact patient and give two tap water enemas and start twice daily miralax    Nelida Meuse III Pager [phone] Mon-Fri 8a-5p [phone] after 5p, weekends, holidays

## 2016-06-27 DIAGNOSIS — K5900 Constipation, unspecified: Secondary | ICD-10-CM

## 2016-06-27 DIAGNOSIS — I10 Essential (primary) hypertension: Secondary | ICD-10-CM | POA: Diagnosis not present

## 2016-06-27 DIAGNOSIS — E43 Unspecified severe protein-calorie malnutrition: Secondary | ICD-10-CM | POA: Insufficient documentation

## 2016-06-27 DIAGNOSIS — I480 Paroxysmal atrial fibrillation: Secondary | ICD-10-CM | POA: Diagnosis not present

## 2016-06-27 DIAGNOSIS — K5791 Diverticulosis of intestine, part unspecified, without perforation or abscess with bleeding: Secondary | ICD-10-CM | POA: Diagnosis not present

## 2016-06-27 DIAGNOSIS — K625 Hemorrhage of anus and rectum: Secondary | ICD-10-CM | POA: Diagnosis not present

## 2016-06-27 LAB — CBC
HCT: 38.5 % — ABNORMAL LOW (ref 39.0–52.0)
Hemoglobin: 13.2 g/dL (ref 13.0–17.0)
MCH: 32 pg (ref 26.0–34.0)
MCHC: 34.3 g/dL (ref 30.0–36.0)
MCV: 93.2 fL (ref 78.0–100.0)
PLATELETS: 215 10*3/uL (ref 150–400)
RBC: 4.13 MIL/uL — ABNORMAL LOW (ref 4.22–5.81)
RDW: 13.1 % (ref 11.5–15.5)
WBC: 10.4 10*3/uL (ref 4.0–10.5)

## 2016-06-27 MED ORDER — HYDROCORTISONE ACETATE 25 MG RE SUPP
25.0000 mg | Freq: Two times a day (BID) | RECTAL | Status: DC
  Administered 2016-06-27: 25 mg via RECTAL
  Filled 2016-06-27: qty 1

## 2016-06-27 MED ORDER — LORAZEPAM 0.5 MG PO TABS
0.5000 mg | ORAL_TABLET | Freq: Once | ORAL | Status: AC
  Administered 2016-06-27: 0.5 mg via ORAL
  Filled 2016-06-27: qty 1

## 2016-06-27 MED ORDER — MAGNESIUM CITRATE PO SOLN
1.0000 | Freq: Once | ORAL | Status: AC
  Administered 2016-06-27: 1 via ORAL
  Filled 2016-06-27: qty 296

## 2016-06-27 NOTE — Progress Notes (Signed)
Initial Nutrition Assessment  DOCUMENTATION CODES:   Severe malnutrition in context of acute illness/injury  INTERVENTION:   -Continue Boost Breeze po TID, each supplement provides 250 kcal and 9 grams of protein -Encourage PO intake -Caregivers to assist with meals -RD to continue to monitor for needs  NUTRITION DIAGNOSIS:   Inadequate oral intake related to dysphagia, lethargy/confusion as evidenced by other (see comment) (per cargiver report).  GOAL:   Patient will meet greater than or equal to 90% of their needs  MONITOR:   PO intake, Supplement acceptance, Labs, Weight trends, I & O's  REASON FOR ASSESSMENT:   Malnutrition Screening Tool    ASSESSMENT:    82 y.o. male, With past medical history of atrial fibrillation on Xarelto, dementia, hyperlipidemia, hypertension, prostate cancer status post radiation seeds, patient was brought from facility for bright red blood per rectum, patient is a poor historian secondary to dementia, history obtained from CNA at bedside, daughter via phone, and ED records, patient started to have bright red blood per rectum Wednesday evening, so far 2-3 episodes, patient on Xarelto for A. fib, patient denies any chest pain, shortness of breath, dyspnea, he has complaints of abdominal pain on admission, found to have distended bladder, Foley catheter inserted with 1 L drained, with no recurrence of abdominal pain  Pt resting well in room with 24 hour caregiver at bedside. Caregiver was able to answer most questions. States pt usually eats well up until 2 weeks ago when he was diagnoses with pneumonia and ever since he has been eating less and now only eats a few bites of his meals (2-3 days now). Pt developed some chewing and swallowing issues the last couple of days, he is usually on a regular diet at his facility but pt's caregiver requested ground meats on tray for patient. Pt is on aspiration precautions at facility. He has also developed the need  for feeding assistance, caregiver states she has never had to help him eat before.   [name] drinking the Boost Breeze supplements, will continue order.  Pt's caregiver states pt's UBW is 143 lb but over the last 2 weeks he has lost 10 lb (7% wt loss x 2 weeks, significant for time frame). Nutrition-Focused physical exam completed. Findings are severe fat depletion, severe muscle depletion, and no edema.   Labs reviewed. Medications: Vitamin D tablet daily, Miralax packet BID, K-DUR tablet daily,   Diet Order:  Diet regular Room service appropriate? Yes; Fluid consistency: Thin  Skin:  Reviewed, no issues  Last BM:  10/29  Height:   Ht Readings from Last 1 Encounters:  06/25/16 5\' 4"  (1.626 m)    Weight:   Wt Readings from Last 1 Encounters:  06/25/16 133 lb 2.5 oz (60.4 kg)    Ideal Body Weight:  54.5 kg  BMI:  Body mass index is 22.86 kg/m.  Estimated Nutritional Needs:   Kcal:  1550-1750  Protein:  70-80g  Fluid:  1.6L/day  EDUCATION NEEDS:   No education needs identified at this time  Clayton Bibles, MS, RD, LDN Pager: [phone] After Hours Pager: [phone]

## 2016-06-27 NOTE — Care Management Obs Status (Signed)
[facility] NOTIFICATION   Patient Details  Name: Wyatt Perkins MRN: [id number] Date of Birth: [birth year]   Medicare Observation Status Notification Given:  Yes (Contacted pt's dtr, Jarquez Mcknight [phone] explained notice. Left copy in room. )    Erenest Rasher, RN 06/27/2016, 5:21 PM

## 2016-06-27 NOTE — Progress Notes (Addendum)
Daily Rounding Note  06/27/2016, 11:23 AM  LOS: 0 days   SUBJECTIVE:   Chief complaint:  Hematochezia.   Passing blood mixed with stool, every hour or so.  No large volume of stool produced after the enemas yesterday.  His RN also says he is pssing blood with urine now.  The foley was removed yesterday but has needed in/oot catheters since as unable to urinate on his own (this is new issue)  OBJECTIVE:         Vital signs in last 24 hours:    Temp:  [98 F (36.7 C)-99.1 F (37.3 C)] 98.4 F (36.9 C) (10/29 1000) Pulse Rate:  [80-91] 91 (10/29 1000) Resp:  [18-19] 18 (10/29 0440) BP: (130-160)/(94-107) 130/94 (10/29 1000) SpO2:  [95 %-97 %] 97 % (10/29 1000) Last BM Date: 06/26/16 Filed Weights   06/25/16 1212 06/25/16 1233 06/25/16 1639  Weight: 62.1 kg (136 lb 12.8 oz) 61.7 kg (136 lb) 60.4 kg (133 lb 2.5 oz)   General: frail, tremulous   Heart: RRR Chest: clear bil.  No cough or labored breathing Abdomen: soft, active BS, NT, ND Rectal: blood at perineum,  Digital exam: soft stool in vault is well mixed with blood.  Extremities: no CCE Neuro/Psych:  Opens eyes to his name, follows commands.  Tremulous. Calm.   Intake/Output from previous day: No intake/output data recorded.  Intake/Output this shift: Total I/O In: 120 [P.O.:120] Out: 400 [Urine:400]  Lab Results:  Recent Labs  06/25/16 1232  06/25/16 2244 06/26/16 0858 06/27/16 0458  WBC 9.8  --   --  10.1 10.4  HGB 14.6  < > 13.4 14.1 13.2  HCT 45.6  < > 41.3 43.9 38.5*  PLT 288  --   --  230 215  < > = values in this interval not displayed. BMET  Recent Labs  06/25/16 1232 06/26/16 0858  NA 138 137  K 4.1 4.3  CL 98* 100*  CO2 33* 30  GLUCOSE 91 94  BUN 21* 18  CREATININE 0.96 0.83  CALCIUM 9.5 8.8*   LFT  Recent Labs  06/25/16 1232  PROT 7.3  ALBUMIN 4.4  AST 39  ALT 29  ALKPHOS 91  BILITOT 1.4*   PT/INR No results for  input(s): LABPROT, INR in the last 72 hours. Hepatitis Panel No results for input(s): HEPBSAG, HCVAB, HEPAIGM, HEPBIGM in the last 72 hours.  Studies/Results: Dg Chest 1 View  Result Date: 06/25/2016 CLINICAL DATA:  Low oxygen.  Pneumonia.  Rectal bleeding. EXAM: CHEST 1 VIEW COMPARISON:  06/04/2015; 07/01/2014; 08/29/2012 FINDINGS: Grossly unchanged enlarged cardiac silhouette and mediastinal contours with prominence of the central pulmonary vasculature and atherosclerotic plaque within the aortic arch. Decreased lung volumes with worsening bibasilar opacities, left greater than right. There is chronic blunting of right costophrenic angle without definite pleural effusion. No pneumothorax. No definite evidence of edema. No acute osseus abnormalities. IMPRESSION: 1. Slightly decreased lung volumes with worsening bibasilar opacities, left greater than right, atelectasis versus infiltrate. Further evaluation with a PA and lateral chest radiograph may be obtained as clinically indicated. 2. Cardiomegaly without evidence of edema. 3.  Aortic Atherosclerosis (ICD10-170.0) Electronically Signed   By: Sandi Mariscal M.D.   On: 06/25/2016 16:06   Scheduled Meds: . atenolol  50 mg Oral Daily  . cholecalciferol  2,000 Units Oral Daily  . famotidine  20 mg Oral Daily  . feeding supplement  1 Container Oral TID BM  .  isosorbide mononitrate  60 mg Oral Daily  . PARoxetine  10 mg Oral Daily  . polyethylene glycol  17 g Oral BID  . potassium chloride SA  20 mEq Oral Daily  . tamsulosin  0.4 mg Oral QPC supper  . traZODone  25 mg Oral QHS   Continuous Infusions:  PRN Meds:.acetaminophen **OR** acetaminophen, calcium carbonate, LORazepam, ondansetron **OR** ondansetron (ZOFRAN) IV   ASSESMENT:   *  Bleding per rectum in setting of fecal impaction and anticoagulation (Xarelto). ? Stercoral ulcer.    Other considerations are radiation proctitis, diverticular, hemorrhoidal bleeding BID Miralax in place.   *   Hx PAF.  Xarelto on hold.  Need to reevaluate the  risk/benefit ratio, current situation looks to  now favor permanent discontinuation of Xarelto.   *  Urinary retention and hematuria in setting of in/out catheterization.   *  Dementia.    constipation  PLAN   *  Continue effective regimen for constipation.  Added Anusol HC suppositories.  Bottle of mag citrate.    *   CBC in AM.      Azucena Freed  06/27/2016, 11:23 AM Pager: [phone]  I have discussed the case with the PA, and that is the plan I formulated. I personally interviewed and examined the patient.  I saw this patient while two of his very attentive caregivers were cleaning him after a BM. He is really having rust-colored soft stool every 2-3 hours.  If it was frank rectal bleeding before, it is not so now.  Last OAC dose was 3 days ago. I think we may have to do a procto/sigmoidoscopy to understand the cause.  Since Hgb remains normal, it seems likely to be low grade hemorrhoidal, stercoral ulcer, XRT proctopathy. I do not know the details of his prostate cancer, but he also has urinary retention now for unclear reasons.  So it is worth considering that prostate malignancy could be playing a role here.  Daughter reportedly concerned about any procedure requiring sedation, but such a scope might be done unsedated.  So we are trying to get him cleaned out better with suppositories and now some magnesium citrate. Change to full liquid diet with protein-calorie supplement drinks for a better chance of getting his bowels evacuated.  I am going to stop the anusol suppositories.  Dr Fuller Plan will assume consult service tomorrow.    Nelida Meuse III Pager [phone]  Mon-Fri 8a-5p [phone] after 5p, weekends, holidays

## 2016-06-27 NOTE — Progress Notes (Signed)
PT Cancellation Note  Patient Details Name: Wyatt Perkins MRN: [id number] DOB: 05/16/1926   Cancelled Treatment:    Reason Eval/Treat Not Completed: Patient not medically ready (has had Ativan and is asleep, sitter/caregiver present. States that he has 24/7 caregivers and was to transition to [facility] care unit this week./ )   Claretha Cooper 06/27/2016, 11:42 AM Tresa Endo PT [phone]

## 2016-06-27 NOTE — Progress Notes (Addendum)
PROGRESS NOTE    Wyatt Perkins  [id number] DOB: 01/17/1926 DOA: 06/25/2016 PCP: Mathews Argyle, MD   Brief Narrative: 82 year old male with history of hypertension, dyslipidemia, dementia, atrial fibrillation on xarelto, prostate cancer status post radiation therapy presented with bright red blood per rectum.  Assessment & Plan:   #Lower GI bleed: Unknown if it is diverticular bleeding versus related with constipation, radiation proctitis. Hemoglobin is between 12 to 14. Had a bowel movement with blood mixed as per nursing status. Evaluated by gastroenterologist, on bowel regimen. Anticoagulation on hold.  -Diet and continue supportive care -Monitor labs.  #Acute urinary retention requiring Foley catheter insertion: Continue Flomax. The Foley catheter was removed yesterday. Required 1 strict This morning because of unable to urinate. We will continue with bladder scan today. If he cannot pass urine on his own then we may have to reinsert the Foley catheter with outpatient follow-up. -Patient had episodes of hematuria today likely related with foley catheter and xarelto.   #Paroxysmal atrial fibrillation: Continue to monitor heart rate. Given GI bleed in this elderly patient who has risk of fall, I will discontinue anticoagulation. He can follow-up with his PCP outpatient.  #Recently treated bacterial pneumonia with chronic hypoxic shortly failure on home oxygen: Continue breathing treatment and oxygen to maintain saturation.  #Hypertension: Continue atenolol, Imdur. Monitor blood pressure  #Dementia with behavioral issue mostly agitation: Continue with supportive care. Home care aide at bedside. Patient's mental status is around baseline as per home care aid and nurse.  #Severe protein calorie malnutrition: Dietary referral. Encourage oral intake.  DVT prophylaxis: SCD. Code Status: DO NOT RESUSCITATE Family Communication: No family present at bedside. Disposition Plan:  Likely discharge to senior living in 1-2 days    Consultants:   Gastroenterology  Procedures: None Antimicrobials: None  Subjective: Patient was seen and examined at bedside. Patient was alert awake and following simple commands. He was not oriented. Apparently this is his baseline mental status as per bedside home aide nurse. Patient denied headache, dizziness, nausea, vomiting, abdominal pain. He was eating breakfast. Objective: Vitals:   06/26/16 2059 06/27/16 0127 06/27/16 0440 06/27/16 1000  BP: (!) 148/100 (!) 159/100 (!) 152/107 (!) 130/94  Pulse: 80  91 91  Resp: 19  18 18   Temp: 99 F (37.2 C)  98.7 F (37.1 C) 98.4 F (36.9 C)  TempSrc: Axillary  Axillary Axillary  SpO2: 95%  96% 97%  Weight:      Height:        Intake/Output Summary (Last 24 hours) at 06/27/16 1231 Last data filed at 06/27/16 1003  Gross per 24 hour  Intake              120 ml  Output              400 ml  Net             -280 ml   Filed Weights   06/25/16 1212 06/25/16 1233 06/25/16 1639  Weight: 62.1 kg (136 lb 12.8 oz) 61.7 kg (136 lb) 60.4 kg (133 lb 2.5 oz)    Examination:  General exam: Elderly male, not in distress Respiratory system: Clear to auscultation. Respiratory effort normal.  Cardiovascular system: Regular rate and rhythm, S1-S2 normal. No edema. Gastrointestinal system: Abdomen soft, nontender and nondistended. Bowel sound positive Central nervous system: Alert, awake and not oriented Extremities: Symmetric 5 x 5 power. Skin: No rashes, lesions or ulcers Psychiatry: Has dementia     Data Reviewed: I  have personally reviewed following labs and imaging studies  CBC:  Recent Labs Lab 06/25/16 1232 06/25/16 1745 06/25/16 2244 06/26/16 0858 06/27/16 0458  WBC 9.8  --   --  10.1 10.4  HGB 14.6 14.0 13.4 14.1 13.2  HCT 45.6 43.1 41.3 43.9 38.5*  MCV 98.1  --   --  98.0 93.2  PLT 288  --   --  230 123456   Basic Metabolic Panel:  Recent Labs Lab 06/25/16 1232  06/26/16 0858  NA 138 137  K 4.1 4.3  CL 98* 100*  CO2 33* 30  GLUCOSE 91 94  BUN 21* 18  CREATININE 0.96 0.83  CALCIUM 9.5 8.8*   GFR: Estimated Creatinine Clearance: 51.5 mL/min (by C-G formula based on SCr of 0.83 mg/dL). Liver Function Tests:  Recent Labs Lab 06/25/16 1232  AST 39  ALT 29  ALKPHOS 91  BILITOT 1.4*  PROT 7.3  ALBUMIN 4.4   No results for input(s): LIPASE, AMYLASE in the last 168 hours. No results for input(s): AMMONIA in the last 168 hours. Coagulation Profile: No results for input(s): INR, PROTIME in the last 168 hours. Cardiac Enzymes: No results for input(s): CKTOTAL, CKMB, CKMBINDEX, TROPONINI in the last 168 hours. BNP (last 3 results) No results for input(s): PROBNP in the last 8760 hours. HbA1C: No results for input(s): HGBA1C in the last 72 hours. CBG: No results for input(s): GLUCAP in the last 168 hours. Lipid Profile: No results for input(s): CHOL, HDL, LDLCALC, TRIG, CHOLHDL, LDLDIRECT in the last 72 hours. Thyroid Function Tests: No results for input(s): TSH, T4TOTAL, FREET4, T3FREE, THYROIDAB in the last 72 hours. Anemia Panel: No results for input(s): VITAMINB12, FOLATE, FERRITIN, TIBC, IRON, RETICCTPCT in the last 72 hours. Sepsis Labs: No results for input(s): PROCALCITON, LATICACIDVEN in the last 168 hours.  No results found for this or any previous visit (from the past 240 hour(s)).       Radiology Studies: Dg Chest 1 View  Result Date: 06/25/2016 CLINICAL DATA:  Low oxygen.  Pneumonia.  Rectal bleeding. EXAM: CHEST 1 VIEW COMPARISON:  06/04/2015; 07/01/2014; 08/29/2012 FINDINGS: Grossly unchanged enlarged cardiac silhouette and mediastinal contours with prominence of the central pulmonary vasculature and atherosclerotic plaque within the aortic arch. Decreased lung volumes with worsening bibasilar opacities, left greater than right. There is chronic blunting of right costophrenic angle without definite pleural effusion.  No pneumothorax. No definite evidence of edema. No acute osseus abnormalities. IMPRESSION: 1. Slightly decreased lung volumes with worsening bibasilar opacities, left greater than right, atelectasis versus infiltrate. Further evaluation with a PA and lateral chest radiograph may be obtained as clinically indicated. 2. Cardiomegaly without evidence of edema. 3.  Aortic Atherosclerosis (ICD10-170.0) Electronically Signed   By: Sandi Mariscal M.D.   On: 06/25/2016 16:06        Scheduled Meds: . atenolol  50 mg Oral Daily  . cholecalciferol  2,000 Units Oral Daily  . famotidine  20 mg Oral Daily  . feeding supplement  1 Container Oral TID BM  . hydrocortisone  25 mg Rectal BID  . isosorbide mononitrate  60 mg Oral Daily  . magnesium citrate  1 Bottle Oral Once  . PARoxetine  10 mg Oral Daily  . polyethylene glycol  17 g Oral BID  . potassium chloride SA  20 mEq Oral Daily  . tamsulosin  0.4 mg Oral QPC supper  . traZODone  25 mg Oral QHS   Continuous Infusions:    LOS: 0 days  Time spent: 24 minutes.    Dron Tanna Furry, MD [facility] Pager [phone]  If 7PM-7AM, please contact night-coverage [email or web address] Password [facility] 06/27/2016, 12:31 PM

## 2016-06-27 NOTE — Progress Notes (Signed)
Private sitters at bedside report that patient is intermittently agitated and restless and at [facility] usually takes something prn for agitation. States in addition, they are able to wheel him in wheelchair around facility which distracts him and helps with agitation. Requests Ativan for agitation. Advised that Ativan is prn daily only and was given this am at 0902, but can contact on call MD to see if additional dose can be given if needed in evening. Will page on call MD. Will continue to monitor.

## 2016-06-28 DIAGNOSIS — R319 Hematuria, unspecified: Secondary | ICD-10-CM | POA: Diagnosis present

## 2016-06-28 DIAGNOSIS — E785 Hyperlipidemia, unspecified: Secondary | ICD-10-CM | POA: Diagnosis present

## 2016-06-28 DIAGNOSIS — Z87891 Personal history of nicotine dependence: Secondary | ICD-10-CM | POA: Diagnosis not present

## 2016-06-28 DIAGNOSIS — G3183 Dementia with Lewy bodies: Secondary | ICD-10-CM | POA: Diagnosis present

## 2016-06-28 DIAGNOSIS — K922 Gastrointestinal hemorrhage, unspecified: Principal | ICD-10-CM

## 2016-06-28 DIAGNOSIS — Z8701 Personal history of pneumonia (recurrent): Secondary | ICD-10-CM | POA: Diagnosis not present

## 2016-06-28 DIAGNOSIS — E039 Hypothyroidism, unspecified: Secondary | ICD-10-CM | POA: Diagnosis present

## 2016-06-28 DIAGNOSIS — I110 Hypertensive heart disease with heart failure: Secondary | ICD-10-CM | POA: Diagnosis present

## 2016-06-28 DIAGNOSIS — Z888 Allergy status to other drugs, medicaments and biological substances status: Secondary | ICD-10-CM | POA: Diagnosis not present

## 2016-06-28 DIAGNOSIS — Z7901 Long term (current) use of anticoagulants: Secondary | ICD-10-CM | POA: Diagnosis not present

## 2016-06-28 DIAGNOSIS — E43 Unspecified severe protein-calorie malnutrition: Secondary | ICD-10-CM

## 2016-06-28 DIAGNOSIS — J189 Pneumonia, unspecified organism: Secondary | ICD-10-CM | POA: Diagnosis present

## 2016-06-28 DIAGNOSIS — F0281 Dementia in other diseases classified elsewhere with behavioral disturbance: Secondary | ICD-10-CM

## 2016-06-28 DIAGNOSIS — Z8546 Personal history of malignant neoplasm of prostate: Secondary | ICD-10-CM | POA: Diagnosis not present

## 2016-06-28 DIAGNOSIS — I10 Essential (primary) hypertension: Secondary | ICD-10-CM | POA: Diagnosis not present

## 2016-06-28 DIAGNOSIS — K219 Gastro-esophageal reflux disease without esophagitis: Secondary | ICD-10-CM | POA: Diagnosis present

## 2016-06-28 DIAGNOSIS — Z9981 Dependence on supplemental oxygen: Secondary | ICD-10-CM | POA: Diagnosis not present

## 2016-06-28 DIAGNOSIS — I480 Paroxysmal atrial fibrillation: Secondary | ICD-10-CM | POA: Diagnosis present

## 2016-06-28 DIAGNOSIS — K5641 Fecal impaction: Secondary | ICD-10-CM | POA: Diagnosis present

## 2016-06-28 DIAGNOSIS — F02818 Dementia in other diseases classified elsewhere, unspecified severity, with other behavioral disturbance: Secondary | ICD-10-CM

## 2016-06-28 DIAGNOSIS — I5032 Chronic diastolic (congestive) heart failure: Secondary | ICD-10-CM | POA: Diagnosis present

## 2016-06-28 DIAGNOSIS — Z66 Do not resuscitate: Secondary | ICD-10-CM | POA: Diagnosis present

## 2016-06-28 DIAGNOSIS — Z961 Presence of intraocular lens: Secondary | ICD-10-CM | POA: Diagnosis present

## 2016-06-28 DIAGNOSIS — Z820 Family history of epilepsy and other diseases of the nervous system: Secondary | ICD-10-CM | POA: Diagnosis not present

## 2016-06-28 DIAGNOSIS — Z79899 Other long term (current) drug therapy: Secondary | ICD-10-CM | POA: Diagnosis not present

## 2016-06-28 DIAGNOSIS — R1314 Dysphagia, pharyngoesophageal phase: Secondary | ICD-10-CM | POA: Diagnosis not present

## 2016-06-28 DIAGNOSIS — R339 Retention of urine, unspecified: Secondary | ICD-10-CM | POA: Diagnosis present

## 2016-06-28 LAB — CBC
HEMATOCRIT: 43.5 % (ref 39.0–52.0)
Hemoglobin: 14.3 g/dL (ref 13.0–17.0)
MCH: 31.7 pg (ref 26.0–34.0)
MCHC: 32.9 g/dL (ref 30.0–36.0)
MCV: 96.5 fL (ref 78.0–100.0)
Platelets: 240 10*3/uL (ref 150–400)
RBC: 4.51 MIL/uL (ref 4.22–5.81)
RDW: 13.3 % (ref 11.5–15.5)
WBC: 11.3 10*3/uL — ABNORMAL HIGH (ref 4.0–10.5)

## 2016-06-28 LAB — GLUCOSE, CAPILLARY: Glucose-Capillary: 105 mg/dL — ABNORMAL HIGH (ref 65–99)

## 2016-06-28 MED ORDER — GERHARDT'S BUTT CREAM
TOPICAL_CREAM | Freq: Three times a day (TID) | CUTANEOUS | Status: DC
  Administered 2016-06-28 (×2): via TOPICAL
  Administered 2016-06-29: 1 via TOPICAL
  Administered 2016-06-29 (×2): via TOPICAL
  Filled 2016-06-28: qty 1

## 2016-06-28 MED ORDER — FAMOTIDINE IN NACL 20-0.9 MG/50ML-% IV SOLN
20.0000 mg | Freq: Two times a day (BID) | INTRAVENOUS | Status: DC
  Administered 2016-06-28 – 2016-06-30 (×4): 20 mg via INTRAVENOUS
  Filled 2016-06-28 (×5): qty 50

## 2016-06-28 MED ORDER — DEXTROSE-NACL 5-0.9 % IV SOLN
INTRAVENOUS | Status: DC
  Administered 2016-06-28 – 2016-06-29 (×2): via INTRAVENOUS

## 2016-06-28 MED ORDER — FAMOTIDINE 20 MG PO TABS: 20.0000 mg | ORAL_TABLET | Freq: Two times a day (BID) | ORAL | Status: DC

## 2016-06-28 MED ORDER — FLUCONAZOLE IN SODIUM CHLORIDE 200-0.9 MG/100ML-% IV SOLN
200.0000 mg | Freq: Once | INTRAVENOUS | Status: DC
  Filled 2016-06-28: qty 100

## 2016-06-28 NOTE — Progress Notes (Signed)
LCSWA met with patient daughter-Betsy at bedside. LCSWA discussed patient disposition to [facility] and addressed concerns. [facility] faxed clinical information to facility.

## 2016-06-28 NOTE — Evaluation (Addendum)
Clinical/Bedside Swallow Evaluation Patient Details  Name: Wyatt Perkins MRN: [id number] Date of Birth: August 14, 1926  Today's Date: 06/28/2016 Time: SLP Start Time (ACUTE ONLY): 25 SLP Stop Time (ACUTE ONLY): 1440 SLP Time Calculation (min) (ACUTE ONLY): 35 min  Past Medical History:  Past Medical History:  Diagnosis Date  . Abnormality of gait 2011  . Anxiety state, unspecified 2013  . Atrial fibrillation ([place]) 2013  . Carpal tunnel syndrome 2011  . Cataract   . Cervicalgia 2013  . Debility, unspecified 09/01/2012  . Disturbance of skin sensation 2011  . Dysrhythmia    atrial fibrillation  . GERD (gastroesophageal reflux disease) 12/20/2012  . Height loss   . Hyperlipemia   . Hypertension   . Loss of weight 10/25/2012  . Lumbago 2007  . Malignant neoplasm of prostate ([place]) [id number]  . Myalgia and myositis, unspecified 2011  . Nocturia 2013  . Open wound of forehead, without mention of complication [id number]  . Open wound of forehead, without mention of complication [id number]  . Other atopic dermatitis and related conditions 2013  . Other bursitis disorders 2012  . Other specified disease of sebaceous glands 2012  . Rash and other nonspecific skin eruption   . Reflux esophagitis 2007  . Sebaceous cyst 2012  . Shortness of breath 2013  . Unspecified constipation 03/29/2013  . Unspecified hypothyroidism 2011  . Unspecified vitamin D deficiency 2011  . Urge incontinence 2010   Past Surgical History:  Past Surgical History:  Procedure Laterality Date  . APPENDECTOMY  2000  . CARDIOVERSION N/A 10/10/2012   Procedure: CARDIOVERSION;  Surgeon: Laverda Page, MD;  Location: [place];  Service: Cardiovascular;  Laterality: N/A;  . CATARACT EXTRACTION W/ INTRAOCULAR LENS  IMPLANT, BILATERAL  2007, 2009  . EYE SURGERY  2007-2009   extraction/IOL implants  . LEG SURGERY     left   HPI:  82 yo male adm to [facility] with with GI bleed. Hx of dementia, Afib, prostate  cancer, HTN, dysphagia.  Swallow evaluation ordered due to concerns for aspiration.     Assessment / Plan / Recommendation Clinical Impression  He has h/o dysphagia diagnosed in 2014 per OP MBS- daughter reports his swallowing improved.  Weight loss of 10 pounds noted per RD note and pt recently had pna (viral per caregivers).   Cough on secretions and after ALL po intake noted today (ice chip, nectar via tsp) concerning for aspiration/penetration.   Pt suspected to be weak and have residuals retained in pharynx with decreased awareness.  Cough is weak and not effective = despite verbal/visual encouragment.   Caregivers report pt with progressive coughing with intake even with modifications from straw, cup to tsp liquid boluses.  Per caregiver, pt recently had pneumonia - causing SLP to be concerned re: possible deconditioning further impairing baseline mild deficits.  Recommend NPO except needed medication and ice chips for comfort.  MBS next date to allow instrumental evaluation.  Educated pt and caregiver to recommendations using teach back and demonstrated use of suction to caregiver.     Aspiration Risk  Risk for inadequate nutrition/hydration;Severe aspiration risk    Diet Recommendation NPO except meds;Ice chips PRN after oral care        Other  Recommendations Oral Care Recommendations: Oral care QID   Follow up Recommendations        Frequency and Duration            Prognosis        Swallow Study  General Date of Onset: 06/28/16 HPI: 82 yo male adm to [facility] with with GI bleed. Hx of dementia, Afib, prostate cancer, HTN, dysphagia.  Swallow evaluation ordered due to concerns for aspiration.   Type of Study: Bedside Swallow Evaluation Diet Prior to this Study: Thin liquids Temperature Spikes Noted: No Respiratory Status: Nasal cannula History of Recent Intubation: No Behavior/Cognition: Confused;Distractible;Doesn't follow directions Oral Cavity Assessment: Dried  secretions Oral Care Completed by SLP: Yes Oral Cavity - Dentition: Dentures, top Self-Feeding Abilities: Total assist Patient Positioning: Upright in bed Baseline Vocal Quality: Low vocal intensity Volitional Cough: Other (Comment);Cognitively unable to elicit (reflexive cough weak) Volitional Swallow: Unable to elicit    Oral/Motor/Sensory Function Overall Oral Motor/Sensory Function: Generalized oral weakness   Ice Chips Ice chips: Impaired Presentation: Spoon Oral Phase Impairments: Reduced lingual movement/coordination;Reduced labial seal;Poor awareness of bolus Oral Phase Functional Implications: Prolonged oral transit;Oral holding Pharyngeal Phase Impairments: Suspected delayed Swallow;Decreased hyoid-laryngeal movement;Cough - Delayed   Thin Liquid Thin Liquid: Not tested    Nectar Thick Nectar Thick Liquid: Impaired Presentation: Spoon Oral Phase Impairments: Reduced lingual movement/coordination;Poor awareness of bolus Oral phase functional implications: Prolonged oral transit;Oral holding Pharyngeal Phase Impairments: Decreased hyoid-laryngeal movement;Cough - Delayed;Multiple swallows;Suspected delayed Swallow   Honey Thick Honey Thick Liquid: Not tested   Puree Puree: Not tested   Solid   GO   Solid: Not tested    Functional Assessment Tool Used: bse, clinical judgement Functional Limitations: Swallowing Swallow Current Status [id number]): 100 percent impaired, limited or restricted Swallow Goal Status [id number]): At least 80 percent but less than 100 percent impaired, limited or restricted   Luanna Salk, [place] [facility] SLP [phone]

## 2016-06-28 NOTE — NC FL2 (Addendum)
MEDICAID FL2 LEVEL OF CARE SCREENING TOOL     IDENTIFICATION  Patient Name: Wyatt Perkins Birthdate: 13-Apr-1926 Sex: male Admission Date (Current Location): 06/25/2016  [facility] and [place] Number:  Herbalist and Address:  [facility],  [place] [address] [place]      Provider Number: [id number]  Attending Physician Name and Address:  Rosita Fire, MD  Relative Name and Phone Number:       Current Level of Care: SNF Recommended Level of Care: [place] Prior Approval Number:    Date Approved/Denied:   PASRR Number:    Discharge Plan: SNF    Current Diagnoses: Patient Active Problem List   Diagnosis Date Noted  . Lewy body dementia with behavioral disturbance   . Protein-calorie malnutrition, severe 06/27/2016  . GI bleed 06/25/2016  . HTN (hypertension) 04/10/2015  . Esophageal reflux 12/21/2014  . Thyroid activity decreased 12/21/2014  . Mild cognitive impairment with memory loss 12/21/2014  . Contracture of finger joint 12/24/2013  . Flatulence 11/12/2013  . Osteoarthritis of hand 11/12/2013  . Dysphagia, pharyngoesophageal phase 05/24/2013  . Diastolic heart failure ([place]) 05/24/2013  . Encounter for long-term (current) use of other medications 05/24/2013  . Other malaise and fatigue 05/24/2013  . Nasal congestion 04/27/2013  . Unspecified constipation 03/29/2013  . Hypothyroidism 02/05/2013  . Malignant neoplasm of prostate ([place]) 02/05/2013  . GERD (gastroesophageal reflux disease) 12/20/2012  . Anxiety state, unspecified 12/20/2012  . Atrial fibrillation ([place]) 10/10/2012    Orientation RESPIRATION BLADDER Height & Weight     Self  O2 (2L) Indwelling catheter Weight: 133 lb 2.5 oz (60.4 kg) Height:  5\' 4"  (162.6 cm)  BEHAVIORAL SYMPTOMS/MOOD NEUROLOGICAL BOWEL NUTRITION STATUS  [place]   Continent Diet (Please see D/C Summary)  AMBULATORY STATUS COMMUNICATION OF NEEDS Skin    Extensive Assist Verbally Normal                       Personal Care Assistance Level of Assistance  Feeding, Bathing, Dressing Bathing Assistance: Total assistance Feeding assistance: Total assistance Dressing Assistance: Total assistance     Functional Limitations Info  Sight, Hearing, Speech Sight Info: Adequate Hearing Info: Adequate Speech Info: Adequate    SPECIAL CARE FACTORS FREQUENCY  PT (By licensed PT), Speech therapy     PT Frequency: 5              Contractures Contractures Info: Not present    Additional Factors Info  Code Status, Allergies Code Status Info: DNR Allergies Info:  Simvastatin, Crestor Rosuvastatin Calcium, Lipitor Atorvastatin, Statins           Current Medications (06/28/2016):  This is the current hospital active medication list Current Facility-Administered Medications  Medication Dose Route Frequency Provider Last Rate Last Dose  . acetaminophen (TYLENOL) tablet 650 mg  650 mg Oral Q6H PRN Albertine Patricia, MD   650 mg at 06/28/16 1325   Or  . acetaminophen (TYLENOL) suppository 650 mg  650 mg Rectal Q6H PRN Albertine Patricia, MD      . atenolol (TENORMIN) tablet 50 mg  50 mg Oral Daily Albertine Patricia, MD   50 mg at 06/27/16 1124  . calcium carbonate (TUMS - dosed in mg elemental calcium) chewable tablet 200 mg of elemental calcium  1 tablet Oral TID PRN Albertine Patricia, MD      . cholecalciferol (VITAMIN D) tablet 2,000 Units  2,000 Units  Oral Daily Albertine Patricia, MD   2,000 Units at 06/27/16 1123  . famotidine (PEPCID) tablet 20 mg  20 mg Oral BID Dron Tanna Furry, MD      . feeding supplement (BOOST / RESOURCE BREEZE) liquid 1 Container  1 Container Oral TID BM Albertine Patricia, MD   1 Container at 06/27/16 2015  . fluconazole (DIFLUCAN) IVPB 200 mg  200 mg Intravenous Once Dron Tanna Furry, MD      . Gerhardt's butt cream   Topical TID Dron Tanna Furry, MD      . isosorbide mononitrate (IMDUR)  24 hr tablet 60 mg  60 mg Oral Daily Albertine Patricia, MD   60 mg at 06/27/16 1121  . LORazepam (ATIVAN) tablet 0.25 mg  0.25 mg Oral Daily PRN Albertine Patricia, MD   0.25 mg at 06/27/16 0902  . ondansetron (ZOFRAN) tablet 4 mg  4 mg Oral Q6H PRN Albertine Patricia, MD       Or  . ondansetron (ZOFRAN) injection 4 mg  4 mg Intravenous Q6H PRN Silver Huguenin Elgergawy, MD      . PARoxetine (PAXIL) tablet 10 mg  10 mg Oral Daily Albertine Patricia, MD   10 mg at 06/27/16 1129  . polyethylene glycol (MIRALAX / GLYCOLAX) packet 17 g  17 g Oral BID Nelida Meuse III, MD   17 g at 06/26/16 1851  . potassium chloride SA (K-DUR,KLOR-CON) CR tablet 20 mEq  20 mEq Oral Daily Albertine Patricia, MD   20 mEq at 06/27/16 1127  . tamsulosin (FLOMAX) capsule 0.4 mg  0.4 mg Oral QPC supper Albertine Patricia, MD   0.4 mg at 06/27/16 1759  . traZODone (DESYREL) tablet 25 mg  25 mg Oral QHS Albertine Patricia, MD   25 mg at 06/27/16 2204     Discharge Medications: Please see discharge summary for a list of discharge medications.  Relevant Imaging Results:  Relevant Lab Results:   Additional Information SS#[id number]  Lia Hopping, LCSW

## 2016-06-28 NOTE — Progress Notes (Signed)
     [facility] Progress Note  Subjective: Confused, trying to get out of bed.   Objective:  Vital signs in last 24 hours: Temp:  [98.2 F (36.8 C)-98.6 F (37 C)] 98.6 F (37 C) (10/30 0653) Pulse Rate:  [64-95] 89 (10/30 0653) Resp:  [15-18] 16 (10/30 0653) BP: (130-173)/(70-109) 162/82 (10/30 0658) SpO2:  [90 %-100 %] 90 % (10/30 0653) Last BM Date: 06/27/16 General:   thin white male in NAD [place]:  Regular rate and rhythm, no lower extremity edema Abdomen:  Soft, nondistended, nontender.  Normal bowel sounds.    Neurologic:  Alert , confused, trying to get out bed, pulling at foley.  Skin: Perianal skin is very red with some excoriations. Residual light yellowish brown stool in perianal area  Lab Results:  Recent Labs  06/26/16 0858 06/27/16 0458 06/28/16 0442  WBC 10.1 10.4 11.3*  HGB 14.1 13.2 14.3  HCT 43.9 38.5* 43.5  PLT 230 215 240   BMET  Recent Labs  06/25/16 1232 06/26/16 0858  NA 138 137  K 4.1 4.3  CL 98* 100*  CO2 33* 30  GLUCOSE 91 94  BUN 21* 18  CREATININE 0.96 0.83  CALCIUM 9.5 8.8*   LFT  Recent Labs  06/25/16 1232  PROT 7.3  ALBUMIN 4.4  AST 39  ALT 29  ALKPHOS 91  BILITOT 1.4*    Assessment / Plan:  82 year old male admitted with rectal bleeding on Xarelto / constipation. Bowels are still in process of being purged, got Mg Citrate yesterday, daily Miralax. Had several loose BMs during the night per staff. On exam there is light yellowish brown stool around perianal area. No visible blood. Patient's hgb has remained stable --->14.3. Patient's medical power of attorney is coming into town tonight. It doesn't sound like patient's family is interested in patient undergoing invasive procedure requiring sedation. In addition, patient would be difficult to prep for a colonoscopy given his confusion / agitation.    Wyatt Perkins  06/28/2016, 9:15 AM  Pager number [phone]    Attending physician's note   I  have taken an interval history, reviewed the chart and examined the patient. I agree with the Advanced Practitioner's note, impression and recommendations.  Constipation/impaction is improving. Continue daily Miralax long term. No recurrent rectal bleeding. Hb stable at 14.3. Given recent bleeding we would like to hold anticoagulation for 2-3 more days. Defer decision on anticoagulation to primary team/PCP. Since the family/POA wants to avoid a sedated procedure and since hematochezia has stopped no GI procedures are planned. GI is available if needed.   Lucio Edward, MD [facility] [phone] Mon-Fri 8a-5p [phone] after 5p, weekends, holidays

## 2016-06-28 NOTE — Progress Notes (Signed)
LCSWA met with patient and caregiver at bedside and explained reason for consult. LCSWA contacted patient daughter- medical POA [name]. LCSWA explained PT is recommending SNF but patient is under observation with Medicare insurance and medicare will not cover cost for PT. She reports patient is already receiving PT through [facility]Home Health agency and will need to go to a seperate building at [facility] for recommended SNF-rehab.  Pt. Daughter expressed frustration and reports pt. Son is financial POA. She reports he will be contacting this Probation officer to follow up with decision.

## 2016-06-28 NOTE — Progress Notes (Signed)
Bladder scan performed with 450 cc noted. Will insert foley per MD orders. Will continue to monitor.

## 2016-06-28 NOTE — Progress Notes (Signed)
Pt having semi formed stools. Peri-care and wash up completed. Inner buttocks redness noted. Discussed barrier cream usage with sitter, reminding to make sure skin is completely dry before applying barrier cream. Pt remains agitated at times. Will continue to monitor.

## 2016-06-28 NOTE — Consult Note (Addendum)
[facility] Nurse wound consult note Reason for Consult: erythema and partial thickness tissue loss consistent with incontinence associated dermatitis and fungal overgrowth Wound type: moisture associated skin damage, specifically incontinence associated dermatitis Pressure Ulcer POA: No Measurement: Affected area involves the scrotum, perianal area, inner thighs and coccyx Wound [id number] pinpoint openings in a diffuse erythematous area with satellite lesions Drainage (amount, consistency, odor) scant serous Periwound:erythema as described above Dressing procedure/placement/frequency:A mattress replacement with low air loss feature is provided to correct microclimate and reduce pressure, bilateral heel boots will float heels. Skin care with Dr. Everrett Coombe butt cream will provide zinc oxide, hydrocortisone cream and antifungal (Lotrimin) coverage.  A one-time dose of systemic antifungal (e.g., diflucan) would be beneficial.  If you agree, please order. Guidance for Nursing is provided via the Orders for turning and repositioning. [facility] nursing team will not follow, but will remain available to this patient, the nursing and medical teams.  Please re-consult if needed. Thanks, Maudie Flakes, MSN, RN, [place], Arther Abbott  Pager# [phone]

## 2016-06-28 NOTE — Evaluation (Signed)
Physical Therapy Evaluation Patient Details Name: DEOTIS BOODRAM MRN: [id number] DOB: 1926-03-30 Today's Date: 06/28/2016   History of Present Illness  82 yo male admitted with GI bleed. Hx of dementia, Afib, prostate ca, HTN.   Clinical Impression  On eval, pt required Max assist (+2 for safety) for mobility. He stood x2 with a RW at EOB. Max multimodal cueing required for all tasks. Caregiver present during session and assisted as well. Pt presents with general weakness, decreased activity tolerance, and impaired gait and balance. Recommend ST rehab at SNF prior to pt returning to ALF.     Follow Up Recommendations SNF    Equipment Recommendations  None recommended by PT    Recommendations for Other Services       Precautions / Restrictions Precautions Precautions: Fall Restrictions Weight Bearing Restrictions: No      Mobility  Bed Mobility Overal bed mobility: Needs Assistance Bed Mobility: Supine to Sit;Sit to Supine     Supine to sit: +2 for safety/equipment;Max assist Sit to supine: +2 for safety/equipment;Max assist   General bed mobility comments: Assist for trunk and bil LEs. Increased time and multimodal cueing required. Utilized bedpad for scooting, positioning  Transfers Overall transfer level: Needs assistance Equipment used: Rolling walker (2 wheeled) Transfers: Sit to/from Stand Sit to Stand: Max assist;+2 safety/equipment         General transfer comment: x2. Assist to rise, stabilize, control descent. Multimodal cueing required for safety, technique, hand/LE placement. Noted flexed posture and leaning towards R side  Ambulation/Gait             General Gait Details: Pt unable to safely attempt at this time  Stairs            Wheelchair Mobility    Modified Rankin (Stroke Patients Only)       Balance Overall balance assessment: Needs assistance Sitting-balance support: Feet supported Sitting balance-Leahy Scale: Poor    Postural control: Right lateral lean Standing balance support: Bilateral upper extremity supported Standing balance-Leahy Scale: Poor                               Pertinent Vitals/Pain Pain Assessment: Faces Faces Pain Scale: Hurts even more Pain Location: neck Pain Descriptors / Indicators: Grimacing Pain Intervention(s): Limited activity within patient's tolerance;Repositioned    Home Living Family/patient expects to be discharged to:: Skilled nursing facility   Available Help at Discharge: Personal care attendant;Available 24 hours/day           Home Equipment: Walker - 2 wheels      Prior Function Level of Independence: Needs assistance   Gait / Transfers Assistance Needed: ambulatory with RW inside of apt           Hand Dominance        Extremity/Trunk Assessment   Upper Extremity Assessment: Generalized weakness           Lower Extremity Assessment: Generalized weakness      Cervical / Trunk Assessment: Kyphotic  Communication   Communication: No difficulties  Cognition Arousal/Alertness:  (decreased alertness. Pt would tend to close eyes often) Behavior During Therapy: [facility] for tasks assessed/performed Overall Cognitive Status: History of cognitive impairments - at baseline                      General Comments      Exercises     Assessment/Plan    PT Assessment Patient  needs continued PT services  PT Problem List Decreased strength;Decreased mobility;Decreased balance;Decreased skin integrity;Decreased cognition;Decreased knowledge of use of DME          PT Treatment Interventions DME instruction;Therapeutic activities;Therapeutic exercise;Gait training;Balance training;Functional mobility training;Patient/family education    PT Goals (Current goals can be found in the Care Plan section)  Acute Rehab PT Goals Patient Stated Goal: per caregiver, to be able to walk PT Goal Formulation: Patient unable to  participate in goal setting Time For Goal Achievement: 07/12/16    Frequency Min 3X/week   Barriers to discharge        Co-evaluation               End of Session Equipment Utilized During Treatment: Gait belt Activity Tolerance: Patient limited by fatigue Patient left: in bed;with call bell/phone within reach;with nursing/sitter in room           Time: 1039-1059 PT Time Calculation (min) (ACUTE ONLY): 20 min   Charges:   PT Evaluation $PT Eval Low Complexity: 1 Procedure     PT G Codes:        Weston Anna, MPT Pager: [phone]

## 2016-06-28 NOTE — Progress Notes (Addendum)
PROGRESS NOTE    Wyatt Perkins  [id number] DOB: 06-Jan-1926 DOA: 06/25/2016 PCP: Mathews Argyle, MD   Brief Narrative: 82 year old male with history of hypertension, dyslipidemia, dementia, atrial fibrillation on xarelto, prostate cancer status post radiation therapy presented with bright red blood per rectum.  Assessment & Plan:   #Lower GI bleed: Unknown if it is diverticular bleeding versus related with constipation, radiation proctitis. Hemoglobin is stable with no further GI bleed. Anticoagulation on hold. GI consult appreciated. Continue bowel regimen and supportive care.   #Acute urinary retention requiring Foley catheter insertion: Continue Flomax. Patient unable to void after the Foley catheter was removed. I discussed with the patient's daughter at bedside. Plan to continue Foley catheter with outpatient follow-up. The hematuria resolving.   #Paroxysmal atrial fibrillation: Heart rate is controlled. I discussed with the patient's daughter [name] regarding the benefit and risk of anticoagulation. I recommended to hold anticoagulation until he will be seen by his PCP or cardiologist. Patient already has GI bleed, hematuria and has increased fall risk. Patient's daughter agreed with the plan.  #Recently treated bacterial pneumonia with chronic hypoxic shortly failure on home oxygen: Continue breathing treatment and oxygen to maintain saturation.  #Hypertension: Continue atenolol, Imdur. Patient has intermittent elevated blood pressure may have been contributed by agitation.  #Dysphagia likely oropharyngeal: Patient with thick oral secretions. His speech and swallow evaluation consulted. We will follow-up recommendation. I will keep patient nothing by mouth today. Start D5 normal saline. Change Pepcid to IV twice a day.  #Dementia with behavioral issue mostly agitation: Patient with intermittent agitation. He likely has end stage dementia. Continue PT, OT evaluation. Social  worker evaluation ongoing for safe discharge plan, discussed with the social worker and the care manager team today. -Patient likely needs to go to skilled nursing facility. He is already DO NOT RESUSCITATE. I discussed with the patient's daughter regarding his treatment plan including hospice care. She agreed, palliative consult called. I spoke with Dr. Domingo Cocking from palliative care service.  # Pressure ulcer on scrotum, perianal area, inner thighs and coccyx: wound care referral, dressing change and frequent repositioning.  #Severe protein calorie malnutrition: Dietary referral. Speech and swallow evaluation ongoing.  DVT prophylaxis: SCD. Code Status: DO NOT RESUSCITATE Family Communication: I discussed extensively with the patient's daughter [name] at bedside. I spent about 20 minutes during discussion only. Disposition Plan: Likely discharge to senior living in 1-2 days    Consultants:   Gastroenterology  Procedures: None Antimicrobials: None  Subjective: Patient was seen and examined at bedside. [name] alert awake and following simple commands. Earlier this morning he was complaining of right-sided headache which improved by the time I saw him. He denied shortness of breath, chest pain however, the history is unreliable because of his dementia. Objective: Vitals:   06/27/16 2109 06/27/16 2140 06/28/16 0653 06/28/16 0658  BP: (!) 173/104 (!) 162/70 (!) 167/109 (!) 162/82  Pulse: 64  89   Resp: 16  16   Temp: 98.4 F (36.9 C)  98.6 F (37 C)   TempSrc: Axillary  Axillary   SpO2: 91%  90%   Weight:      Height:        Intake/Output Summary (Last 24 hours) at 06/28/16 1515 Last data filed at 06/28/16 0700  Gross per 24 hour  Intake               90 ml  Output  700 ml  Net             -610 ml   Filed Weights   06/25/16 1212 06/25/16 1233 06/25/16 1639  Weight: 62.1 kg (136 lb 12.8 oz) 61.7 kg (136 lb) 60.4 kg (133 lb 2.5 oz)    Examination:  General  exam: Demented elderly male lying on bed comfortable, not in distress Respiratory system: Clear to auscultation bilateral, respiratory effort normal..  Cardiovascular system: Regular rate rhythm, S1-S2 normal. No pedal edema Gastrointestinal system: Abdomen soft, nontender, nondistended. Bowel sound positive Central nervous system: Alert, awake but not oriented. Extremities: Symmetric 5 x 5 power. Skin: No rashes, lesions or ulcers Psychiatry: Has dementia     Data Reviewed: I have personally reviewed following labs and imaging studies  CBC:  Recent Labs Lab 06/25/16 1232 06/25/16 1745 06/25/16 2244 06/26/16 0858 06/27/16 0458 06/28/16 0442  WBC 9.8  --   --  10.1 10.4 11.3*  HGB 14.6 14.0 13.4 14.1 13.2 14.3  HCT 45.6 43.1 41.3 43.9 38.5* 43.5  MCV 98.1  --   --  98.0 93.2 96.5  PLT 288  --   --  230 215 [id number]   Basic Metabolic Panel:  Recent Labs Lab 06/25/16 1232 06/26/16 0858  NA 138 137  K 4.1 4.3  CL 98* 100*  CO2 33* 30  GLUCOSE 91 94  BUN 21* 18  CREATININE 0.96 0.83  CALCIUM 9.5 8.8*   GFR: Estimated Creatinine Clearance: 51.5 mL/min (by C-G formula based on SCr of 0.83 mg/dL). Liver Function Tests:  Recent Labs Lab 06/25/16 1232  AST 39  ALT 29  ALKPHOS 91  BILITOT 1.4*  PROT 7.3  ALBUMIN 4.4   No results for input(s): LIPASE, AMYLASE in the last 168 hours. No results for input(s): AMMONIA in the last 168 hours. Coagulation Profile: No results for input(s): INR, PROTIME in the last 168 hours. Cardiac Enzymes: No results for input(s): CKTOTAL, CKMB, CKMBINDEX, TROPONINI in the last 168 hours. BNP (last 3 results) No results for input(s): PROBNP in the last 8760 hours. HbA1C: No results for input(s): HGBA1C in the last 72 hours. CBG: No results for input(s): GLUCAP in the last 168 hours. Lipid Profile: No results for input(s): CHOL, HDL, LDLCALC, TRIG, CHOLHDL, LDLDIRECT in the last 72 hours. Thyroid Function Tests: No results for  input(s): TSH, T4TOTAL, FREET4, T3FREE, THYROIDAB in the last 72 hours. Anemia Panel: No results for input(s): VITAMINB12, FOLATE, FERRITIN, TIBC, IRON, RETICCTPCT in the last 72 hours. Sepsis Labs: No results for input(s): PROCALCITON, LATICACIDVEN in the last 168 hours.  No results found for this or any previous visit (from the past 240 hour(s)).       Radiology Studies: No results found.      Scheduled Meds: . atenolol  50 mg Oral Daily  . cholecalciferol  2,000 Units Oral Daily  . famotidine  20 mg Oral BID  . feeding supplement  1 Container Oral TID BM  . fluconazole (DIFLUCAN) IV  200 mg Intravenous Once  . Gerhardt's butt cream   Topical TID  . isosorbide mononitrate  60 mg Oral Daily  . PARoxetine  10 mg Oral Daily  . polyethylene glycol  17 g Oral BID  . potassium chloride SA  20 mEq Oral Daily  . tamsulosin  0.4 mg Oral QPC supper  . traZODone  25 mg Oral QHS   Continuous Infusions:    LOS: 0 days    Time spent: 36 minutes.  Dron Tanna Furry, MD [facility] Pager [phone]  If 7PM-7AM, please contact night-coverage [email or web address] Password [email or web address] 06/28/2016, 3:15 PM

## 2016-06-28 NOTE — Progress Notes (Signed)
Palliative Medicine consult noted. Due to high referral volume, there may be a delay seeing this patient. Please call the Palliative Medicine Team office at [phone] if recommendations are needed in the interim.  Thank you for inviting [place] to see this patient.  Marjie Skiff Fleming Prill, RN, BSN, [facility] 06/28/2016 3:23 PM Cell [phone] 8:00-4:00 Monday-Friday Office [phone]

## 2016-06-28 NOTE — Progress Notes (Signed)
   06/28/16 1125  PT Time Calculation  PT Start Time (ACUTE ONLY) 1039  PT Stop Time (ACUTE ONLY) 1059  PT Time Calculation (min) (ACUTE ONLY) 20 min  PT G-Codes **NOT FOR INPATIENT CLASS**  Functional Assessment Tool Used (clinical judgement)  Functional Limitation Mobility: Walking and moving around  Mobility: Walking and Moving Around Current Status [id number]) CK  Mobility: Walking and Moving Around Goal Status [id number]) CJ  PT General Charges  $$ ACUTE PT VISIT 1 Procedure  PT Evaluation  $PT Eval Low Complexity 1 Procedure   Weston Anna, MPT [phone]

## 2016-06-29 LAB — GLUCOSE, CAPILLARY
GLUCOSE-CAPILLARY: 103 mg/dL — AB (ref 65–99)
GLUCOSE-CAPILLARY: 103 mg/dL — AB (ref 65–99)
GLUCOSE-CAPILLARY: 102 mg/dL — AB (ref 65–99)
GLUCOSE-CAPILLARY: 120 mg/dL — AB (ref 65–99)

## 2016-06-29 MED ORDER — MORPHINE SULFATE (CONCENTRATE) 10 MG/0.5ML PO SOLN
10.0000 mg | ORAL | Status: DC | PRN
  Administered 2016-06-30: 10 mg via SUBLINGUAL
  Filled 2016-06-29: qty 0.5

## 2016-06-29 MED ORDER — HYDRALAZINE HCL 20 MG/ML IJ SOLN
5.0000 mg | INTRAMUSCULAR | Status: DC | PRN
  Administered 2016-06-29 – 2016-06-30 (×2): 5 mg via INTRAVENOUS
  Filled 2016-06-29 (×2): qty 1

## 2016-06-29 MED ORDER — LORAZEPAM 0.5 MG PO TABS
0.2500 mg | ORAL_TABLET | ORAL | Status: DC | PRN
  Administered 2016-06-29 – 2016-06-30 (×2): 0.25 mg via ORAL
  Filled 2016-06-29 (×2): qty 1

## 2016-06-29 MED ORDER — BISACODYL 10 MG RE SUPP: 10.0000 mg | Freq: Every day | RECTAL | Status: DC | PRN

## 2016-06-29 NOTE — Progress Notes (Signed)
CSW will manage hospice referral with [facility]

## 2016-06-29 NOTE — Progress Notes (Signed)
PROGRESS NOTE    Wyatt Perkins  [id number] DOB: 12/10/1925 DOA: 06/25/2016 PCP: Mathews Argyle, MD   Brief Narrative: 82 year old male with history of hypertension, dyslipidemia, dementia, atrial fibrillation on xarelto, prostate cancer status post radiation therapy presented with bright red blood per rectum. Patient has end stage dementia, dysphagia. Discussed with the patient's daughter in detail at bedside. I discussed about goals of care and hospice care. She agreed with the plan. Palliative care consult appreciated. Pt will be discharged to SNF with hospice care. Discussed with the social worker, likely SNF is ready for him tomorrow.   Assessment & Plan:   #Lower GI bleed: Unknown if it is diverticular bleeding versus related with constipation, radiation proctitis. Hemoglobin is stable with no further GI bleed. Anticoagulation on hold. GI consult appreciated. Continue bowel regimen and supportive care.  -no GI bleed today.  #Acute urinary retention requiring Foley catheter insertion: Continue Flomax. Patient unable to void after the Foley catheter was removed. I discussed with the patient's daughter at bedside. Plan to continue Foley catheter with outpatient follow-up. The hematuria resolving.   #Paroxysmal atrial fibrillation: Heart rate is controlled. I discussed with the patient's daughter [name] yesterday and today regarding the benefit and risk of anticoagulation. I recommended to hold anticoagulation. Patient already has GI bleed, hematuria and has increased fall risk. Pt's daughter reported she will discuss this with patient's cardiologist and PCP. Patient's daughter agreed with the plan.  #Recently treated bacterial pneumonia with chronic hypoxic shortly failure on home oxygen: Continue breathing treatment and oxygen to maintain saturation.  #Hypertension: Continue atenolol, Imdur. Patient has intermittent elevated blood pressure may have been contributed by  agitation.  #Dysphagia likely oropharyngeal: Speech and swallow evaluation on going. Likely continue pleasure feeding/ dysphagia diet.  #Dementia with behavioral issue mostly agitation: Patient with intermittent agitation. He likely has end stage dementia. -Continue PT, OT and social worker evaluation for safe discharge plan -dc SNF with hospice care.  # Pressure ulcer on scrotum, perianal area, inner thighs and coccyx: wound care referral, dressing change and frequent repositioning.  #Severe protein calorie malnutrition: Dietary referral. Speech and swallow evaluation ongoing.  DVT prophylaxis: SCD. Code Status: DO NOT RESUSCITATE Family Communication: I discussed with the patient's daughter at bedside in detail.  Disposition Plan: Likely discharge to SNF with hospice soon, in 1-2 days.    Consultants:   Gastroenterology  Procedures: None Antimicrobials: None  Subjective: Patient was seen and examined at bedside. Pt was sleeping this morning. In the afternoon, he woke up.He was alert, awake and comfortable. Denied headache, chest pain, SOB. ROS unreliable because of AMS>  Objective: Vitals:   06/28/16 2011 06/29/16 0626 06/29/16 0800 06/29/16 1409  BP: (!) 152/96 (!) 141/75 136/68 132/85  Pulse: 89 (!) 54 (!) 58 85  Resp: 18 18 18    Temp: 97.7 F (36.5 C) 97.8 F (36.6 C) 97.6 F (36.4 C) 97.8 F (36.6 C)  TempSrc: Oral Axillary Axillary Oral  SpO2: 99% 99%    Weight:      Height:        Intake/Output Summary (Last 24 hours) at 06/29/16 1518 Last data filed at 06/29/16 1300  Gross per 24 hour  Intake          1061.25 ml  Output              502 ml  Net           559.25 ml   Filed Weights   06/25/16 1212 06/25/16  1233 06/25/16 1639  Weight: 62.1 kg (136 lb 12.8 oz) 61.7 kg (136 lb) 60.4 kg (133 lb 2.5 oz)    Examination:  General exam: elderly male with dementia lying on bed comfortable. Respiratory system: CTA b/l,no wheeze or crackle  Cardiovascular  system: RRR s1s2 nl Gastrointestinal system:BS+. Abdomen soft, nontender Central nervous system: Alert, awake but not oriented. Extremities: Symmetric 5 x 5 power. Skin: No rashes, lesions or ulcers Psychiatry: Has dementia    Data Reviewed: I have personally reviewed following labs and imaging studies  CBC:  Recent Labs Lab 06/25/16 1232 06/25/16 1745 06/25/16 2244 06/26/16 0858 06/27/16 0458 06/28/16 0442  WBC 9.8  --   --  10.1 10.4 11.3*  HGB 14.6 14.0 13.4 14.1 13.2 14.3  HCT 45.6 43.1 41.3 43.9 38.5* 43.5  MCV 98.1  --   --  98.0 93.2 96.5  PLT 288  --   --  230 215 [id number]   Basic Metabolic Panel:  Recent Labs Lab 06/25/16 1232 06/26/16 0858  NA 138 137  K 4.1 4.3  CL 98* 100*  CO2 33* 30  GLUCOSE 91 94  BUN 21* 18  CREATININE 0.96 0.83  CALCIUM 9.5 8.8*   GFR: Estimated Creatinine Clearance: 51.5 mL/min (by C-G formula based on SCr of 0.83 mg/dL). Liver Function Tests:  Recent Labs Lab 06/25/16 1232  AST 39  ALT 29  ALKPHOS 91  BILITOT 1.4*  PROT 7.3  ALBUMIN 4.4   No results for input(s): LIPASE, AMYLASE in the last 168 hours. No results for input(s): AMMONIA in the last 168 hours. Coagulation Profile: No results for input(s): INR, PROTIME in the last 168 hours. Cardiac Enzymes: No results for input(s): CKTOTAL, CKMB, CKMBINDEX, TROPONINI in the last 168 hours. BNP (last 3 results) No results for input(s): PROBNP in the last 8760 hours. HbA1C: No results for input(s): HGBA1C in the last 72 hours. CBG:  Recent Labs Lab 06/28/16 2015 06/29/16 0743 06/29/16 1136  GLUCAP 105* 103* 103*   Lipid Profile: No results for input(s): CHOL, HDL, LDLCALC, TRIG, CHOLHDL, LDLDIRECT in the last 72 hours. Thyroid Function Tests: No results for input(s): TSH, T4TOTAL, FREET4, T3FREE, THYROIDAB in the last 72 hours. Anemia Panel: No results for input(s): VITAMINB12, FOLATE, FERRITIN, TIBC, IRON, RETICCTPCT in the last 72 hours. Sepsis Labs: No  results for input(s): PROCALCITON, LATICACIDVEN in the last 168 hours.  No results found for this or any previous visit (from the past 240 hour(s)).       Radiology Studies: No results found.      Scheduled Meds: . atenolol  50 mg Oral Daily  . famotidine (PEPCID) IV  20 mg Intravenous Q12H  . feeding supplement  1 Container Oral TID BM  . fluconazole (DIFLUCAN) IV  200 mg Intravenous Once  . Gerhardt's butt cream   Topical TID  . isosorbide mononitrate  60 mg Oral Daily  . PARoxetine  10 mg Oral Daily  . polyethylene glycol  17 g Oral BID  . potassium chloride SA  20 mEq Oral Daily  . tamsulosin  0.4 mg Oral QPC supper  . traZODone  25 mg Oral QHS   Continuous Infusions: . dextrose 5 % and 0.9% NaCl 75 mL/hr at 06/28/16 1702     LOS: 1 day    Time spent: 30 minutes.    Sharne Linders Tanna Furry, MD [facility] Pager [phone]  If 7PM-7AM, please contact night-coverage [email or web address] Password [email or web address] 06/29/2016, 3:18 PM

## 2016-06-29 NOTE — Consult Note (Signed)
   [facility] CM Inpatient Consult   06/29/2016  Wyatt Perkins 1926-04-11 [id number]    Patient screened for potential [facility] Care Management services. Chart reviewed. Noted current discharge plan is likely for SNF. Also noted Palliative consult pending.  There are no identifiable [facility] Care Management needs at this time.  Marthenia Rolling, MSN-Ed, RN,BSN [facility] Liaison [phone]

## 2016-06-29 NOTE — Progress Notes (Signed)
SLP Cancellation Note  Patient Details Name: Wyatt Perkins MRN: [id number] DOB: February 24, 1926   Cancelled treatment:       Reason Eval/Treat Not Completed: Fatigue/lethargy limiting ability to participate   Claudie Fisherman, [facility] SLP [phone]

## 2016-06-29 NOTE — Evaluation (Signed)
SLP Cancellation Note  Patient Details Name: Wyatt Perkins MRN: [id number] DOB: [birth year]   Cancelled treatment:       Reason Eval/Treat Not Completed:  (pt currently sleeping, per family he received Ativan last pm)  Will continue efforts for MBS when pt more alert.    Macario Golds 06/29/2016, 10:41 AM

## 2016-06-29 NOTE — Progress Notes (Signed)
CSW is asisting with patient disposition. [name] to will follow up with [facility]. LCSWA spoke with Environmental health practitioner at [facility] to confirm.

## 2016-06-29 NOTE — Consult Note (Signed)
Consultation Note Date: 06/29/2016   Patient Name: Wyatt Perkins  DOB: [birth year]  MRN: [id number]  Age / Sex: 82 y.o., male  PCP: Lajean Manes, MD Referring Physician: Rosita Fire, MD  Reason for Consultation: Establishing goals of care, Non pain symptom management and Psychosocial/spiritual support  HPI/Patient Profile: 82 y.o. male  with past medical history of dementia, HTN, HLD, A. Fib on Xarelto, and Prostate Cancer admitted on 06/25/2016 with rectal bleeding. He had recurrent episodes of rectal bleeding starting on Wednesday evening through Sunday. His Xarelto was held on admission with resultant resolution of the bleeding on Monday. Hgb/Hct stable since presentation. GI consulted who placed him on a bowel regimen r/t his fecal impaction. His constipation resolved with interventions. His HCPOA, his daughter [name], wished to avoid sedation and invasive procedures, so no subsequent work-up of bleed undertaken.   Clinical Assessment and Goals of Care: Mr. Constante is agitated in bed and unable to get comfortable. His daughter--Betsy--and a [facility] nurse--Deborah--are at the bedside. He is following simple commands, but minimally interactive and not coherently verbal.   Per [name], her father has had a marked decline over the past few months. He has cognitively declined, and experienced increased agitation and restlessness. Furthermore, while he was diagnosed with mild dysphagia in the past, he is now demonstrating strong signs of aspiration with all intake. Overall, he has minimal intake and is loosing weight.  In light of his marked decline, and in accordance with his goals expressed by his daughter, the current plan is to transition him to Hospice services at home (he resides at [facility], likely moving to their memory care unit on his return there). Hospice will ensure ongoing supportive care to  help alleviate his symptom burden, maintain his dignity, and let him die peacefully and comfortably when that time comes. As he is not currently eating and with his marked cognitive decline, I would expect him to die within the next two weeks. This timeline, with the caveat that we never know for sure, was conveyed to [facility].   Primary Decision Maker HCPOA, Betsy   SUMMARY OF RECOMMENDATIONS   -End of life r/t progressive dementia; Hospice at [place] at discharge; CM consulted to facilitate this -Could pursue swallow evaluation to proffer safe feeding recommendations, but would support pleasure feeding in light of GOC -Symptom management  -Agitation/Restlesness: Ativan 0.25mg  q4H PRN  -Pain: Roxanol 10mg  q4H PRN, Tylenol PR q6H PRN  -Bowels: Miralax daily PRN, Dulcolax PR PRN  Code Status/Advance Care Planning:  DNR  Palliative Prophylaxis:   Aspiration, Bowel Regimen, Delirium Protocol, Frequent Pain Assessment, Oral Care, Palliative Wound Care and Turn Reposition  Additional Recommendations (Limitations, Scope, Preferences):  Full Comfort Care  Psycho-social/Spiritual:   Desire for further Chaplaincy support:Did not ask  Additional Recommendations: Education on Hospice and Grief/Bereavement Support  Prognosis:   < 2 weeks  Discharge Planning: Home with Hospice      Primary Diagnoses: Present on Admission: . GI bleed . Atrial fibrillation ([place]) . Diastolic heart failure ([place]) .  Dysphagia, pharyngoesophageal phase . Esophageal reflux . HTN (hypertension) . Hypothyroidism . Malignant neoplasm of prostate ([place]) . Mild cognitive impairment with memory loss   I have reviewed the medical record, interviewed the patient and family, and examined the patient. The following aspects are pertinent.  Past Medical History:  Diagnosis Date  . Abnormality of gait 2011  . Anxiety state, unspecified 2013  . Atrial fibrillation ([place]) 2013  . Carpal tunnel syndrome 2011  .  Cataract   . Cervicalgia 2013  . Debility, unspecified 09/01/2012  . Disturbance of skin sensation 2011  . Dysrhythmia    atrial fibrillation  . GERD (gastroesophageal reflux disease) 12/20/2012  . Height loss   . Hyperlipemia   . Hypertension   . Loss of weight 10/25/2012  . Lumbago 2007  . Malignant neoplasm of prostate ([place]) [id number]  . Myalgia and myositis, unspecified 2011  . Nocturia 2013  . Open wound of forehead, without mention of complication [id number]  . Open wound of forehead, without mention of complication [id number]  . Other atopic dermatitis and related conditions 2013  . Other bursitis disorders 2012  . Other specified disease of sebaceous glands 2012  . Rash and other nonspecific skin eruption   . Reflux esophagitis 2007  . Sebaceous cyst 2012  . Shortness of breath 2013  . Unspecified constipation 03/29/2013  . Unspecified hypothyroidism 2011  . Unspecified vitamin D deficiency 2011  . Urge incontinence 2010   Social History   Social History  . Marital status: Widowed    Spouse name: N/A  . Number of children: N/A  . Years of education: N/A   Occupational History  . retired Freight forwarder    Social History Main Topics  . Smoking status: Former Smoker    Quit date: 12/26/1958  . Smokeless tobacco: Never Used  . Alcohol use 0.6 oz/week    1 Glasses of wine per week     Comment: 1 most nights  . Drug use: No  . Sexual activity: No   Other Topics Concern  . None   Social History Narrative   [name] Widowed (02/2013), was married to [place] since 1951. Worked for [employer] and moved to [place].  Then moved to [facility] for work.  Retired from Chemical engineer.  Lives in [place]  section at [facility] since 08/2012, previously lived in [place] apartment in same community since  2008.   Stopped smoking 1960s.. Drinks moderate alcohol (wine)   Patient has  Advanced planning documents: Living Will, DNR POA   Walks with walker   Exercise:  NuStep 3 x week, personal trainer 3 x week with weights                  Family History  Problem Relation Age of Onset  . Alzheimer's disease Mother   . Cancer Father     lung  . Cancer Sister   . Cancer Brother   . Cancer Brother    Scheduled Meds: . atenolol  50 mg Oral Daily  . famotidine (PEPCID) IV  20 mg Intravenous Q12H  . feeding supplement  1 Container Oral TID BM  . fluconazole (DIFLUCAN) IV  200 mg Intravenous Once  . Gerhardt's butt cream   Topical TID  . isosorbide mononitrate  60 mg Oral Daily  . PARoxetine  10 mg Oral Daily  . polyethylene glycol  17 g Oral BID  . potassium chloride SA  20 mEq Oral Daily  . tamsulosin  0.4 mg Oral QPC supper  . traZODone  25 mg Oral QHS   Continuous Infusions: . dextrose 5 % and 0.9% NaCl 75 mL/hr at 06/28/16 1702   PRN Meds:.[DISCONTINUED] acetaminophen **OR** acetaminophen, bisacodyl, LORazepam, morphine CONCENTRATE, [DISCONTINUED] ondansetron **OR** ondansetron (ZOFRAN) IV Allergies  Allergen Reactions  . Simvastatin     myalgias  . Crestor [Rosuvastatin Calcium] Other (See Comments)    Unknown reaction- on MAR  . Lipitor [Atorvastatin] Other (See Comments)    Unknown reaction- on MAR  . Statins     All statins cause muscle cramps   Review of Systems  -Unable to assess given dementia  Physical Exam  Constitutional: He appears listless. He appears cachectic. He has a sickly appearance.  Eyes: EOM are normal.  Neck: Normal range of motion.  Pulmonary/Chest: Effort normal. No respiratory distress.  Musculoskeletal:  Generalized weakness  Neurological: He appears listless. He is disoriented (dementia).  Skin:  Skin breakdown noted on buttocks, inner thighs, and scrotum.   Psychiatric:  Dementia, pt can follow simple commands but is incoherently verbal.     Vital Signs: BP 136/68 (BP Location: Right Arm)   Pulse (!) 58   Temp 97.6 F (36.4 C) (Axillary)   Resp 18   Ht 5\' 4"  (1.626 m)   Wt 60.4 kg (133 lb  2.5 oz)   SpO2 99%   BMI 22.86 kg/m  Pain Assessment: Faces   Pain Score: Asleep   SpO2: SpO2: 99 % O2 Device:SpO2: 99 % O2 Flow Rate: .O2 Flow Rate (L/min): 3 L/min  IO: Intake/output summary:   Intake/Output Summary (Last 24 hours) at 06/29/16 1327 Last data filed at 06/29/16 0631  Gross per 24 hour  Intake          1061.25 ml  Output              402 ml  Net           659.25 ml    LBM: Last BM Date: 06/28/16 Baseline Weight: Weight: 62.1 kg (136 lb 12.8 oz) Most recent weight: Weight: 60.4 kg (133 lb 2.5 oz)     Palliative Assessment/Data:  Flowsheet Rows   Flowsheet Row Most Recent Value  Intake Tab  Referral Department  Hospitalist  Unit at Time of Referral  Med/Surg Unit  Palliative Care Primary Diagnosis  Cancer  Date Notified  06/28/16  Palliative Care Type  New Palliative care  Reason for referral  Clarify Goals of Care, Counsel Regarding Hospice  Date of Admission  06/25/16  # of days IP prior to Palliative referral  3  Clinical Assessment  Psychosocial & Spiritual Assessment  Palliative Care Outcomes      Time In: 1220 Time Out: 1320 Time Total: 60 minutes Greater than 50%  of this time was spent counseling and coordinating care related to the above assessment and plan.   Signed by: Charlynn Court, NP Palliative Medicine TeamTeam Phone # [phone] (Nights/Weekends)

## 2016-06-30 DIAGNOSIS — R1314 Dysphagia, pharyngoesophageal phase: Secondary | ICD-10-CM

## 2016-06-30 DIAGNOSIS — I10 Essential (primary) hypertension: Secondary | ICD-10-CM

## 2016-06-30 DIAGNOSIS — I480 Paroxysmal atrial fibrillation: Secondary | ICD-10-CM

## 2016-06-30 DIAGNOSIS — F0391 Unspecified dementia with behavioral disturbance: Secondary | ICD-10-CM

## 2016-06-30 LAB — GLUCOSE, CAPILLARY
Glucose-Capillary: 92 mg/dL (ref 65–99)
Glucose-Capillary: 101 mg/dL — ABNORMAL HIGH (ref 65–99)

## 2016-06-30 MED ORDER — CLONIDINE HCL 0.1 MG/24HR TD PTWK: 0.1000 mg | MEDICATED_PATCH | TRANSDERMAL | 0 refills | Status: AC

## 2016-06-30 MED ORDER — MORPHINE SULFATE (CONCENTRATE) 10 MG/0.5ML PO SOLN: 10.0000 mg | ORAL | 0 refills | Status: AC | PRN

## 2016-06-30 MED ORDER — LORAZEPAM 2 MG/ML PO CONC: 0.5000 mg | Freq: Three times a day (TID) | ORAL | 0 refills | Status: AC | PRN

## 2016-06-30 NOTE — Progress Notes (Signed)
SLP Cancellation Note  Patient Details Name: Wyatt Perkins MRN: [id number] DOB: [birth year]   Cancelled treatment:       Reason Eval/Treat Not Completed: Other (comment) (per RN, pt to dc to [facility] today, comfort care measures per chart)   Luanna Salk, [facility] SLP [phone]

## 2016-06-30 NOTE — Discharge Summary (Signed)
Discharge Summary  Wyatt Perkins [id number] DOB: 1926-04-29  PCP: Mathews Argyle, MD  Admit date: 06/25/2016 Discharge date: 06/30/2016  Time spent: >8mins, more than 50% time spent on coordination of care  Recommendations for Outpatient Follow-up:  1. F/u with hospice at SNF 2. Patient may have pleasure feeds, keep foley in place, air mattress for comfort, continue oxygen supplement for comfort  Discharge Diagnoses:  Active Hospital Problems   Diagnosis Date Noted  . Lewy body dementia with behavioral disturbance   . Protein-calorie malnutrition, severe 06/27/2016  . Gastrointestinal hemorrhage 06/25/2016  . HTN (hypertension) 04/10/2015  . Esophageal reflux 12/21/2014  . Mild cognitive impairment with memory loss 12/21/2014  . Diastolic heart failure ([place]) 05/24/2013  . Dysphagia, pharyngoesophageal phase 05/24/2013  . Hypothyroidism 02/05/2013  . Malignant neoplasm of prostate ([place]) 02/05/2013  . Atrial fibrillation ([place]) 10/10/2012    Resolved Hospital Problems   Diagnosis Date Noted Date Resolved  No resolved problems to display.    Discharge Condition: sleeping, does  Not seem in distress  Diet recommendation: pleasure feeds  Filed Weights   06/25/16 1212 06/25/16 1233 06/25/16 1639  Weight: 62.1 kg (136 lb 12.8 oz) 61.7 kg (136 lb) 60.4 kg (133 lb 2.5 oz)    History of present illness:  Wyatt Perkins  is a 82 y.o. male, With past medical history of atrial fibrillation on Xarelto, dementia, hyperlipidemia, hypertension, prostate cancer status post radiation seeds, patient was brought from facility for bright red blood per rectum, patient is a poor historian secondary to dementia, history obtained from CNA at bedside, daughter via phone, and ED records, patient started to have bright red blood per rectum Wednesday evening, so far 2-3 episodes, patient on Xarelto for A. fib, patient denies any chest pain, shortness of breath, dyspnea, he has complaints of  abdominal pain on admission, found to have distended bladder, Foley catheter inserted with 1 L drained, with no recurrence of abdominal pain - In ED workup was significant for hemoglobin of 14.6, patient was noticed to have bright blood in stool, hospitalist requested to admit  GI and palliative care consulted, due to progressive dementia, comfort measures and hospice care recommended, daughter is in agreement. [name] discharged to snf with hospice care/comfort measures.   Hospital Course:  Active Problems:   Atrial fibrillation (HCC)   Hypothyroidism   Malignant neoplasm of prostate (HCC)   Dysphagia, pharyngoesophageal phase   Diastolic heart failure (HCC)   Esophageal reflux   Mild cognitive impairment with memory loss   HTN (hypertension)   Gastrointestinal hemorrhage   Protein-calorie malnutrition, severe   Lewy body dementia with behavioral disturbance   #Lower GI bleed:  Unknown if it is diverticular bleeding versus related with constipation, radiation proctitis. Hemoglobin is stable with no further GI bleed. Anticoagulation on hold. GI consult appreciated.  Continue bowel regimen and supportive care.  Daughter does not want suppository for now  #Acute urinary retention requiring Foley catheter insertion:  Treated with Flomax, now sleep most of time, not safe for oral meds . Patient unable to void after the Foley catheter was removed.  Foley in place at discharge, The hematuria resolving.  Now on comfort measures  #Paroxysmal atrial fibrillation: Heart rate is controlled.  Patient already has GI bleed, hematuria and has increased fall risk. Off anticoagulation, now on comfort measures, daughter reported she will discuss this with patient's cardiologist and PCP if patient is a wake enough to take meds.  #Recently treated bacterial pneumonia with chronic hypoxic  shortly failure on home oxygen: Continue breathing treatment and oxygen to maintain  saturation.  #Hypertension:  He was on atenolol, Imdur. Patient has intermittent elevated blood pressure may have been contributed by agitation. Now sleep most of the time, continue sublingual ativan/morphin prn for pain and agitation, clonidine patch provided at discharge at well  #Dysphagia likely oropharyngeal:  Likely continue pleasure feeding/ dysphagia diet. Comfort measures  #Dementia with behavioral issue mostly agitation:  Patient with intermittent agitation. He likely has end stage dementia. - -dc SNF with hospice care.  # Pressure ulcer on scrotum, perianal area, inner thighs and coccyx: wound care , dressing change, air mattress, comfort measures.  #Severe protein calorie malnutrition: Dietary referral. Pleasure feeds   Code Status: DO NOT RESUSCITATE Family Communication: I discussed with the patient's daughter at bedside in detail.  Disposition Plan:  to SNF with hospice on 11/1.    Consultants:   Gastroenterology  Palliative care  Procedures: None Antimicrobials: None  Discharge Exam: BP (!) 156/102 (BP Location: Right Arm)   Pulse (!) 105   Temp 97.9 F (36.6 C) (Oral)   Resp 18   Ht 5\' 4"  (1.626 m)   Wt 60.4 kg (133 lb 2.5 oz)   SpO2 90%   BMI 22.86 kg/m    General exam: elderly male with dementia lying on bed comfortable. Respiratory system: CTA b/l,no wheeze or crackle  Cardiovascular system: RRR s1s2 nl Gastrointestinal system:BS+. Abdomen soft, nontender  Discharge Instructions You were cared for by a hospitalist during your hospital stay. If you have any questions about your discharge medications or the care you received while you were in the hospital after you are discharged, you can call the unit and asked to speak with the hospitalist on call if the hospitalist that took care of you is not available. Once you are discharged, your primary care physician will handle any further medical issues. Please note that NO REFILLS for any  discharge medications will be authorized once you are discharged, as it is imperative that you return to your primary care physician (or establish a relationship with a primary care physician if you do not have one) for your aftercare needs so that they can reassess your need for medications and monitor your lab values.  Discharge Instructions    Discharge instructions    Complete by:  As directed    Patient may have pleasure feeds, keep foley in place, air mattress for comfort, continue oxygen supplement for comfort       Medication List    STOP taking these medications   acetaminophen 325 MG tablet Commonly known as:  TYLENOL   antiseptic oral rinse Liqd   Arnica [place] Pllt   ARNICARE Gel   atenolol 50 MG tablet Commonly known as:  TENORMIN   calcium carbonate 500 MG chewable tablet Commonly known as:  TUMS - dosed in mg elemental calcium   Cholecalciferol 2000 units Caps   furosemide 20 MG tablet Commonly known as:  LASIX   isosorbide mononitrate 60 MG 24 hr tablet Commonly known as:  IMDUR   loperamide 2 MG capsule Commonly known as:  IMODIUM   LORazepam 0.5 MG tablet Commonly known as:  ATIVAN Replaced by:  LORazepam 2 MG/ML concentrated solution   Melatonin 5 MG Tabs   multivitamin with minerals tablet   PARoxetine 10 MG tablet Commonly known as:  PAXIL   polyethylene glycol packet Commonly known as:  MIRALAX / GLYCOLAX   potassium chloride SA 20 MEQ tablet  Commonly known as:  K-DUR,KLOR-CON   PREPARATION H 0.25-3-14-71.9 % rectal ointment Generic drug:  phenylephrine-shark liver oil-mineral oil-petrolatum   ranitidine 150 MG tablet Commonly known as:  ZANTAC   rivaroxaban 20 MG Tabs tablet Commonly known as:  XARELTO   traZODone 50 MG tablet Commonly known as:  DESYREL   trolamine salicylate 10 % cream Commonly known as:  MYOFLEX   vitamin C 500 MG tablet Commonly known as:  ASCORBIC ACID     TAKE these medications   cloNIDine 0.1  mg/24hr patch Commonly known as:  CATAPRES - Dosed in mg/24 hr Place 1 patch (0.1 mg total) onto the skin once a week.   LORazepam 2 MG/ML concentrated solution Commonly known as:  ATIVAN Place 0.3 mLs (0.6 mg total) under the tongue every 8 (eight) hours as needed for anxiety or sleep. Replaces:  LORazepam 0.5 MG tablet   morphine CONCENTRATE 10 MG/0.5ML Soln concentrated solution Place 0.5 mLs (10 mg total) under the tongue every 4 (four) hours as needed for moderate pain or severe pain.      Allergies  Allergen Reactions  . Simvastatin     myalgias  . Crestor [Rosuvastatin Calcium] Other (See Comments)    Unknown reaction- on MAR  . Lipitor [Atorvastatin] Other (See Comments)    Unknown reaction- on MAR  . Statins     All statins cause muscle cramps      The results of significant diagnostics from this hospitalization (including imaging, microbiology, ancillary and laboratory) are listed below for reference.    Significant Diagnostic Studies: Dg Chest 1 View  Result Date: 06/25/2016 CLINICAL DATA:  Low oxygen.  Pneumonia.  Rectal bleeding. EXAM: CHEST 1 VIEW COMPARISON:  06/04/2015; 07/01/2014; 08/29/2012 FINDINGS: Grossly unchanged enlarged cardiac silhouette and mediastinal contours with prominence of the central pulmonary vasculature and atherosclerotic plaque within the aortic arch. Decreased lung volumes with worsening bibasilar opacities, left greater than right. There is chronic blunting of right costophrenic angle without definite pleural effusion. No pneumothorax. No definite evidence of edema. No acute osseus abnormalities. IMPRESSION: 1. Slightly decreased lung volumes with worsening bibasilar opacities, left greater than right, atelectasis versus infiltrate. Further evaluation with a PA and lateral chest radiograph may be obtained as clinically indicated. 2. Cardiomegaly without evidence of edema. 3.  Aortic Atherosclerosis (ICD10-170.0) Electronically Signed   By:  Sandi Mariscal M.D.   On: 06/25/2016 16:06    Microbiology: No results found for this or any previous visit (from the past 240 hour(s)).   Labs: Basic Metabolic Panel:  Recent Labs Lab 06/25/16 1232 06/26/16 0858  NA 138 137  K 4.1 4.3  CL 98* 100*  CO2 33* 30  GLUCOSE 91 94  BUN 21* 18  CREATININE 0.96 0.83  CALCIUM 9.5 8.8*   Liver Function Tests:  Recent Labs Lab 06/25/16 1232  AST 39  ALT 29  ALKPHOS 91  BILITOT 1.4*  PROT 7.3  ALBUMIN 4.4   No results for input(s): LIPASE, AMYLASE in the last 168 hours. No results for input(s): AMMONIA in the last 168 hours. CBC:  Recent Labs Lab 06/25/16 1232 06/25/16 1745 06/25/16 2244 06/26/16 0858 06/27/16 0458 06/28/16 0442  WBC 9.8  --   --  10.1 10.4 11.3*  HGB 14.6 14.0 13.4 14.1 13.2 14.3  HCT 45.6 43.1 41.3 43.9 38.5* 43.5  MCV 98.1  --   --  98.0 93.2 96.5  PLT 288  --   --  230 215 240   Cardiac  Enzymes: No results for input(s): CKTOTAL, CKMB, CKMBINDEX, TROPONINI in the last 168 hours. BNP: BNP (last 3 results) No results for input(s): BNP in the last 8760 hours.  ProBNP (last 3 results) No results for input(s): PROBNP in the last 8760 hours.  CBG:  Recent Labs Lab 06/29/16 0743 06/29/16 1136 06/29/16 1646 06/29/16 2140 06/30/16 0713  GLUCAP 103* 103* 102* 120* 92       Signed:  Danzig Macgregor MD, PhD  [facility] 06/30/2016, 11:24 AM

## 2016-06-30 NOTE — Progress Notes (Signed)
Palliative   Stopped by to check-in with patient and ensure everything was set-up for transition back to [facility]. Neoma Laming, Mr. Ardelean personal aid, was at the bedside. She expressed no concerns for discharge and reported that Mr. Loner was finally resting comfortably and calm. No further Palliative Care needs at present, and we continue to support his discharge to [facility] with Hospice support.  [place] Team pager [phone]  No charge.

## 2016-06-30 NOTE — Progress Notes (Signed)
[facility] called for Transport 

## 2016-06-30 NOTE — Progress Notes (Signed)
RN called report to Nursing facility. Discharge instructions accompanied pt, left the unit in stable condition. Transported to nursing facility via ambulance.

## 2016-06-30 NOTE — Clinical Social Work Placement (Signed)
   CLINICAL SOCIAL WORK PLACEMENT  NOTE  Date:  06/30/2016  Patient Details  Name: Wyatt Perkins MRN: [id number] Date of Birth: 01/23/1926  Clinical Social Work is seeking post-discharge placement for this patient at the [place] level of care (*CSW will initial, date and re-position this form in  chart as items are completed):  No   Patient/family provided with [facility] Work Department's list of facilities offering this level of care within the geographic area requested by the patient (or if unable, by the patient's family).  Yes   Patient/family informed of their freedom to choose among providers that offer the needed level of care, that participate in Medicare, Medicaid or managed care program needed by the patient, have an available bed and are willing to accept the patient.  Yes   Patient/family informed of [facility]'s ownership interest in [facility] and [facility], as well as of the fact that they are under no obligation to receive care at these facilities.  PASRR submitted to EDS on 06/30/16     PASRR number received on 06/30/16     Existing PASRR number confirmed on       FL2 transmitted to all facilities in geographic area requested by pt/family on       FL2 transmitted to all facilities within larger geographic area on       Patient informed that his/her managed care company has contracts with or will negotiate with certain facilities, including the following:  [facility]     Yes   Patient/family informed of bed offers received.  Patient chooses bed at [facility]     Physician recommends and patient chooses bed at [facility]    Patient to be transferred to [facility] on  .  Patient to be transferred to facility by [facility]     Patient family notified on   of transfer.  Name of family member notified:        PHYSICIAN Please sign DNR, Please sign FL2     Additional Comment:     _______________________________________________ Lia Hopping, LCSW 06/30/2016, 9:58 AM

## 2016-07-06 ENCOUNTER — Non-Acute Institutional Stay (SKILLED_NURSING_FACILITY): Payer: Medicare Other | Admitting: Internal Medicine

## 2016-07-06 ENCOUNTER — Encounter: Payer: Self-pay | Admitting: Internal Medicine

## 2016-07-06 DIAGNOSIS — E43 Unspecified severe protein-calorie malnutrition: Secondary | ICD-10-CM

## 2016-07-06 DIAGNOSIS — F0281 Dementia in other diseases classified elsewhere with behavioral disturbance: Secondary | ICD-10-CM

## 2016-07-06 DIAGNOSIS — K922 Gastrointestinal hemorrhage, unspecified: Secondary | ICD-10-CM | POA: Diagnosis not present

## 2016-07-06 DIAGNOSIS — G3183 Dementia with Lewy bodies: Secondary | ICD-10-CM

## 2016-07-06 DIAGNOSIS — R627 Adult failure to thrive: Secondary | ICD-10-CM | POA: Diagnosis not present

## 2016-07-30 NOTE — Progress Notes (Signed)
**Note Wyatt-Identified via Obfuscation** Patient ID: Wy[facility] [place], male   DOB: 09/28/1925, 82 y.o.   MRN: [id number]  Provider:  Rexene Edison. Mariea [place], D.O., C.M.D. Location:  [facility] Room Number: [facility]:  SNF (31)  PCP: Wyatt Argyle, MD Patient Care Team: Wyatt Manes, MD as PCP - General (Internal Medicine) [facility] Wy[facility] Ferdinand, PhD as Counselor (Psychology) Wyatt Miss, MD as Consulting Physician (Neurosurgery)  Extended Emergency Contact Information Primary Emergency Contact: [facility] Address: [address] [place] [address] [facility] [place] of [place] Phone: [phone] Mobile Phone: [phone] Relation: Daughter Secondary Emergency Contact: Wy[facility] [place] of [place] Phone: [phone] Relation: Son  Code Status: DNR, hospice care Goals of Care: Advanced Directive information Advanced Directives 07-13-16  Does patient have an advance directive? Yes  Type of Advance Directive Out of facility DNR (pink MOST or yellow form);Healthcare Power of Attorney  Does patient want to make changes to advanced directive? -  Copy of advanced directive(s) in chart? Yes  Pre-existing out of facility DNR order (yellow form or pink MOST form) Yellow form placed in chart (order not valid for inpatient use)   Chief Complaint  Patient presents with  . New Admit To SNF    Admit to rehab    HPI: [name] a 82 y.o. male seen today for admission to [facility] for his end of life care.  Mr [facility] [place] has had advancing dementia (now said to be Lewy Body though he didn't have hallucinations early on in his disease), failure to thrive, afib on xarelto, hyperlipidemia, htn, prostate ca s/o radiation seeds and was declining in assisted living.  He was hospitalized from 10/27-11/1 for bright red blood per rectum x 2-3 episodes.  He was also found to have a distended bladder and 1 L of retained urine was  drained.  His initial hgb was 14.6 (but intake had been very poor so hemoconcentrated).  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 Diverticular bleeding as cause.  Palliative care was also consulted and plans were made for comfort measures for him with a hospice referral.  He returned here 11/1 with orders for morphine, ativan, clonidine, and pleasure feeds, foley, air mattress and oxygen.    When I saw him today, he was resting comfortably in the quiet darkened room with his son and caregiver present who both agreed he was resting peacefully.  He had required medication overnight for respiratory distress but had not needed any over the course of the morning.  He was breathing shallowly and appeared flushed.  He was tachycardic. He did not open his eyes during my exam.  His bowel sounds remained hyperactive.    Past Medical History:  Diagnosis Date  . Abnormality of gait 2011  . Anxiety state, unspecified 2013  . Atrial fibrillation ([place]) 2013  . Carpal tunnel syndrome 2011  . Cataract   . Cervicalgia 2013  . Debility, unspecified 09/01/2012  . Disturbance of skin sensation 2011  . Dysrhythmia    atrial fibrillation  . GERD (gastroesophageal reflux disease) 12/20/2012  . Height loss   . Hyperlipemia   . Hypertension   . Loss of weight 10/25/2012  . Lumbago 2007  . Malignant neoplasm of prostate ([place]) [id number]  . Myalgia and myositis, unspecified 2011  . Nocturia 2013  . Open wound of forehead, without mention of complication [id number]  . Open wound of forehead, without mention of complication [id number]  .  Other atopic dermatitis and related conditions 2013  . Other bursitis disorders 2012  . Other specified disease of sebaceous glands 2012  . Rash and other nonspecific skin eruption   . Reflux esophagitis 2007  . Sebaceous cyst 2012  . Shortness of breath 2013  . Unspecified constipation 03/29/2013  . Unspecified hypothyroidism 2011  . Unspecified vitamin D deficiency 2011  . Urge  incontinence 2010   Past Surgical History:  Procedure Laterality Date  . APPENDECTOMY  2000  . CARDIOVERSION N/A 10/10/2012   Procedure: CARDIOVERSION;  Surgeon: Laverda Page, MD;  Location: [facility];  [place]: Cardiovascular;  Laterality: N/A;  . CATARACT EXTRACTION W/ INTRAOCULAR LENS  IMPLANT, BILATERAL  2007, 2009  . EYE SURGERY  2007-2009   extraction/IOL implants  . LEG SURGERY     left    reports that he quit smoking about 57 years ago. He has never used smokeless tobacco. He reports that he drinks about 0.6 oz of alcohol per week . He reports that he does not use drugs. Social History   Social History  . Marital status: Widowed    Spouse name: N/A  . Number of children: N/A  . Years of education: N/A   Occupational History  . retired Freight forwarder    Social History Main Topics  . Smoking status: Former Smoker    Quit date: 12/26/1958  . Smokeless tobacco: Never Used  . Alcohol use 0.6 oz/week    1 Glasses of wine per week     Comment: 1 most nights  . Drug use: No  . Sexual activity: No   Other Topics Concern  . Not on file   Social History Narrative   [name] Widowed (02/2013), was married to [place] since 1951. Worked for [employer] and moved to [place].  Then moved to [facility] for work.  Retired from Chemical engineer.  Lives in [place]  section at [place] since 08/2012, previously lived in [place] apartment in same [place] since  2008.   Stopped smoking 1960s.. Drinks moderate alcohol (wine)   Patient has  Advanced planning documents: Living Will, DNR POA   Walks with walker   Exercise: NuStep 3 x week, personal trainer 3 x week with weights                   Functional Status Survey:    Family History  Problem Relation Age of Onset  . Alzheimer's disease Mother   . Cancer Father     lung  . Cancer Sister   . Cancer Brother   . Cancer Brother     Health Maintenance  Topic Date Due  . ZOSTAVAX  11/14/1987  .  INFLUENZA VACCINE  03/30/2016  . TETANUS/TDAP  02/09/2023  . PNA vac Low Risk Adult  Completed    Allergies  Allergen Reactions  . Simvastatin     myalgias  . Crestor [Rosuvastatin Calcium] Other (See Comments)    Unknown reaction- on MAR  . Lipitor [Atorvastatin] Other (See Comments)    Unknown reaction- on MAR  . Statins     All statins cause muscle cramps      Medication List       Accurate as of 2016/08/03 11:49 AM. Always use your most recent med list.          cloNIDine 0.1 mg/24hr patch Commonly known as:  CATAPRES - Dosed in mg/24 hr Place 1 patch (0.1 mg total) onto the skin once a week.  LORazepam 2 MG/ML concentrated solution Commonly known as:  ATIVAN Place 0.3 mLs (0.6 mg total) under the tongue every 8 (eight) hours as needed for anxiety or sleep.   morphine CONCENTRATE 10 MG/0.5ML Soln concentrated solution Place 0.5 mLs (10 mg total) under the tongue every 4 (four) hours as needed for moderate pain or severe pain.       Review of Systems  Unable to perform ROS: Patient unresponsive    Vitals:   07/17/2016 1143  BP: 129/85  Pulse: 71  Resp: 18  Temp: 98.5 F (36.9 C)  TempSrc: Oral  SpO2: 97%   There is no height or weight on file to calculate BMI. Physical Exam  Constitutional: No distress.  Frail white male with flushed cheeks resting comfortably in bed in rehab  Cardiovascular: Intact distal pulses.   Tachycardic, regular  Pulmonary/Chest:  Shallow breaths, no witnessed apneas  Abdominal:  Hyperactive bowel sounds  Neurological:  Not opening his eyes at present  Skin: Skin is warm and dry. There is erythema.    Labs reviewed: Basic Metabolic Panel:  Recent Labs  06/25/16 1232 06/26/16 0858  NA 138 137  K 4.1 4.3  CL 98* 100*  CO2 33* 30  GLUCOSE 91 94  BUN 21* 18  CREATININE 0.96 0.83  CALCIUM 9.5 8.8*   Liver Function Tests:  Recent Labs  06/25/16 1232  AST 39  ALT 29  ALKPHOS 91  BILITOT 1.4*  PROT 7.3    ALBUMIN 4.4   No results for input(s): LIPASE, AMYLASE in the last 8760 hours. No results for input(s): AMMONIA in the last 8760 hours. CBC:  Recent Labs  06/26/16 0858 06/27/16 0458 06/28/16 0442  WBC 10.1 10.4 11.3*  HGB 14.1 13.2 14.3  HCT 43.9 38.5* 43.5  MCV 98.0 93.2 96.5  PLT 230 215 240   Cardiac Enzymes: No results for input(s): CKTOTAL, CKMB, CKMBINDEX, TROPONINI in the last 8760 hours. BNP: Invalid input(s): POCBNP No results found for: HGBA1C Lab Results  Component Value Date   TSH 2.81 02/05/2015   No results found for: VITAMINB12 No results found for: FOLATE No results found for: IRON, TIBC, FERRITIN  Imaging and Procedures obtained prior to SNF admission: Dg Chest 1 View  Result Date: 06/25/2016 CLINICAL DATA:  Low oxygen.  Pneumonia.  Rectal bleeding. EXAM: CHEST 1 VIEW COMPARISON:  06/04/2015; 07/01/2014; 08/29/2012 FINDINGS: Grossly unchanged enlarged cardiac silhouette and mediastinal contours with prominence of the central pulmonary vasculature and atherosclerotic plaque within the aortic arch. Decreased lung volumes with worsening bibasilar opacities, left greater than right. There is chronic blunting of right costophrenic angle without definite pleural effusion. No pneumothorax. No definite evidence of edema. No acute osseus abnormalities. IMPRESSION: 1. Slightly decreased lung volumes with worsening bibasilar opacities, left greater than right, atelectasis versus infiltrate. Further evaluation with a PA and lateral chest radiograph may be obtained as clinically indicated. 2. Cardiomegaly without evidence of edema. 3.  Aortic Atherosclerosis (ICD10-170.0) Electronically Signed   By: Sandi Mariscal M.D.   On: 06/25/2016 16:06    Assessment/Plan 1. Lewy body dementia with behavioral disturbance -was advancing, but he was still living in [place] with caregivers until his GI bleed  2. Adult failure to thrive -had been losing weight with poor po intake, less  verbal and declining overall with more assistance needed (needed snf)  3. Protein-calorie malnutrition, severe -due to #1 and 2  4. Gastrointestinal hemorrhage, unspecified gastrointestinal hemorrhage type -either due to radiation colitis vs. Diverticular bleeding -  family opted for palliative approach for him given his comorbidities--he is here on hospice and appears to have hours to days to live at this point -cont the roxanol and ativan for comfort; other po meds are not being given and were not even to be ordered upon his return  Family/ staff Communication: discussed with his son and his caregiver and snf nurse  Labs/tests ordered:  None  Anajah Sterbenz L. Kaziah Krizek, D.O. [facility] [address], [place] [address] Cell Phone (Mon-Fri 8am-5pm):  [phone] On Call:  [phone] & follow prompts after 5pm & weekends Office Phone:  [phone] Office Fax:  [phone]

## 2016-07-30 DEATH — deceased
